# Patient Record
Sex: Male | Born: 1970 | Race: White | Hispanic: No | Marital: Married | Smoking: Never smoker
Health system: Southern US, Community
[De-identification: ages and names within clinical notes are randomized; demographics above are authoritative.]

## PROBLEM LIST (undated history)

## (undated) DIAGNOSIS — H409 Unspecified glaucoma: Secondary | ICD-10-CM

## (undated) DIAGNOSIS — Z9889 Other specified postprocedural states: Secondary | ICD-10-CM

## (undated) DIAGNOSIS — R112 Nausea with vomiting, unspecified: Secondary | ICD-10-CM

## (undated) DIAGNOSIS — K219 Gastro-esophageal reflux disease without esophagitis: Secondary | ICD-10-CM

---

## 2000-05-28 ENCOUNTER — Ambulatory Visit (HOSPITAL_COMMUNITY): Admission: RE | Admit: 2000-05-28 | Discharge: 2000-05-28 | Payer: Self-pay | Admitting: *Deleted

## 2000-05-28 ENCOUNTER — Encounter: Payer: Self-pay | Admitting: *Deleted

## 2000-06-01 ENCOUNTER — Ambulatory Visit (HOSPITAL_COMMUNITY): Admission: RE | Admit: 2000-06-01 | Discharge: 2000-06-01 | Payer: Self-pay | Admitting: *Deleted

## 2003-07-03 ENCOUNTER — Ambulatory Visit (HOSPITAL_COMMUNITY): Admission: RE | Admit: 2003-07-03 | Discharge: 2003-07-03 | Payer: Self-pay | Admitting: Internal Medicine

## 2004-04-15 ENCOUNTER — Ambulatory Visit: Payer: Self-pay | Admitting: Internal Medicine

## 2004-05-17 ENCOUNTER — Ambulatory Visit (HOSPITAL_COMMUNITY): Admission: RE | Admit: 2004-05-17 | Discharge: 2004-05-17 | Payer: Self-pay | Admitting: Internal Medicine

## 2004-05-17 ENCOUNTER — Ambulatory Visit: Payer: Self-pay | Admitting: Internal Medicine

## 2004-06-05 ENCOUNTER — Ambulatory Visit: Payer: Self-pay | Admitting: Internal Medicine

## 2004-07-08 ENCOUNTER — Ambulatory Visit (HOSPITAL_COMMUNITY): Admission: RE | Admit: 2004-07-08 | Discharge: 2004-07-08 | Payer: Self-pay | Admitting: Internal Medicine

## 2005-08-20 ENCOUNTER — Ambulatory Visit: Payer: Self-pay | Admitting: Internal Medicine

## 2009-08-30 ENCOUNTER — Ambulatory Visit (HOSPITAL_COMMUNITY): Admission: RE | Admit: 2009-08-30 | Discharge: 2009-08-30 | Payer: Self-pay | Admitting: Internal Medicine

## 2010-06-14 NOTE — Consult Note (Signed)
NAME:  Matthew Carr, Matthew Carr                ACCOUNT NO.:  [id number]   MEDICAL RECORD NO.:  [id number]          PATIENT TYPE:  AMB   LOCATION:                                 FACILITY:   PHYSICIAN:  Lionel December, M.D.    DATE OF BIRTH:  [birth year]   DATE OF CONSULTATION:  04/15/2004  DATE OF DISCHARGE:                                   CONSULTATION   REASON FOR CONSULTATION:  Dysphagia and abnormal esophagogram.   HISTORY OF PRESENT ILLNESS:  Matthew Carr is a 40 year old Caucasian male who has  history of dysphagia.  He was last seen by Dr. Jena Gauss in November 1996.  He  had EGD revealing antral gastritis but normal esophagus.  His esophagus was  dilated with a 54 [place] Maloney dilator.  Postop he did have some  discomfort, but his dysphagia resolved until about 18-24 months ago.  Since  then he has been having dysphagia primarily to solids, particularly bread  and meats.  He also has difficulty swallowing pills.  Lately he has been  having vague discomfort across the chest describing as throbbing.  He was  seen by Dr. Sherwood Gambler.  Barium study was obtained, which revealed noncritical  cervical esophageal web and a tubular narrowing in the thoracic esophagus at  the level of aortic arch.  Barium pill passed across the web without any  difficulty but did not pass the area of stricture.  He was therefore  scheduled for this visit.  The patient complains of heartburn at a frequency  of less than two times a week.  It is relieved with Tums.  He denies  hoarseness or chronic cough.  He does have intermittent need to clear his  throat, but he feels that this is precipitated by postnasal discharge.  He  has a good appetite.  He denies melena or rectal bleeding.  He has  occasional blood on the tissue, which he feels is secondary to hemorrhoids.  There is no prior history of lye ingestion.   He is on Tums p.r.n., NyQuil p.r.n., and Activate cream p.r.n.   PAST MEDICAL HISTORY:  Antral gastritis diagnosed,  and he was treated for H.  pylori gastritis back in November 1996.   ALLERGIES:  None known.   FAMILY HISTORY:  Both parents are in good health.  He does not have any  siblings.   SOCIAL HISTORY:  He is married.  He has two sons.  He works at [employer]  in [place].  Does not smoke cigarettes, and drinks alcohol very occasionally.   PHYSICAL EXAMINATION:  GENERAL:  A pleasant, well-developed, well-nourished  Caucasian male who is in no acute distress.  VITAL SIGNS:  He weighs 185-1/2 pounds.  He is 5 feet 10 inches tall.  Pulse  68 per minute, blood pressure 124/86, temperature is 97.8.  HEENT:  Conjunctivae are pink, sclerae are nonicteric.  Oropharyngeal mucosa  is normal.  NECK:  No neck masses noted.  CARDIAC:  Regular rhythm, normal S1 and S2.  No murmur or gallop noted.  CHEST:  Lungs are clear to auscultation.  ABDOMEN:  Symmetrical, soft, without tenderness, organomegaly or masses.  EXTREMITIES:  No peripheral edema or clubbing noted.   Barium study films are available for review.  These were done about six  months ago.  He does have an area of tubular narrowing site where a barium  pill got lodged.  There also appears to be narrowing at distal esophagus.  The pill never made it to this segment.   ASSESSMENT:  Matthew Carr is a 40 year old Caucasian male with history of vague  retrosternal discomfort and intermittent solid food dysphagia, who is noted  to have a noncritical esophageal web, tubular narrowing to thoracic or  midesophagus, and he may also have narrowing at gastroesophageal junction.  He gives history of infrequent heartburn.  He is not on any maintenance  therapy.  There is also no history of lye ingestion in the past.   It is possible that he has chronic gastroesophageal reflux disease, more or  less silent, leading to esophageal stricture.  I doubt that he has Barrett's  esophagus.   RECOMMENDATIONS:  Diagnostic esophagogastroduodenoscopy with esophageal   dilation to be performed within the next three weeks or so.  I would hold  off using PPI until the time of endoscopy.  If he has evidence of erosive  esophagitis, it needs to be documented rather than treating him, documented  prior to any chronic therapy.   I reviewed the procedure risks with the patient, and he is agreeable.   I would like to thank Dr. Sherwood Gambler for the opportunity to participate in the  care of this gentleman.      NR/MEDQ  D:  04/15/2004  T:  04/16/2004  Job:  [id number]   cc:   [facility]  Fax: [phone]   Madelin Rear. Sherwood Gambler, MD  [address]  [place]  [place] [address]  Fax: [phone]

## 2010-06-14 NOTE — Op Note (Signed)
Matthew Carr, Matthew Carr                ACCOUNT NO.:  [id number]   MEDICAL RECORD NO.:  [id number]          PATIENT TYPE:  AMB   LOCATION:  DAY                           FACILITY:  [facility]   PHYSICIAN:  Lionel December, M.D.    DATE OF BIRTH:  11/04/1970   DATE OF PROCEDURE:  05/17/2004  DATE OF DISCHARGE:                                 OPERATIVE REPORT   PROCEDURE:  Esophagogastroduodenoscopy with esophageal dilation.   INDICATIONS:  Martavious is a 40 year old Caucasian male who presents with solid  food dysphagia.  Barium study reveals a ring in the proximal esophagus and  narrowing at the body of the esophagus and possibly distally.  He did have  EGD/ED 10 years ago.  This was performed by Dr. Phylliss Bob.  His esophagus was  dilated by passing 54-French Maloney dilator.  He experienced chest pain for  few days with, but his dysphagia did improve until 18 or 24 months ago.  He  does not have chronic heartburn or regurgitation.  The procedure risks were  reviewed the patient, informed consent was obtained.   PREMEDICATION:  Cetacaine spray for pharyngeal topical anesthesia, Demerol  50 mg IV, Versed 14 mg IV.   FINDINGS:  Procedure performed in endoscopy suite.  The patient's vital  signs and O2 saturation were monitored during procedure and remained stable.  The patient was placed left lateral recumbent position and Olympus video  scope was passed via oropharynx without any difficulty into esophagus.   Esophagus:  There was web just below UES, which was well-visualized and a  picture taken for the record.  There was a stricture at 25-26 cm.  This was  initially dilated by passing the scope through it.  There was a noncritical narrowing at the body as well as distal esophagus,  but I was able pass the scope without any difficulty.  The GE junction was  at 40 cm.  No hernia was noted.  There was a circumferential fine wrinkling  to mucosa at body suggestive of eosinophilic esophagitis.   Stomach:   It was empty and distended very well with insufflation.  Folds of  the proximal stomach were normal.  Examination of the mucosa at body,  antrum, pyloric channel as well as angularis, fundus and cardia was normal.   Duodenum:  There was a single erosion at bulb.  The rest of the mucosa was  normal.  Mucosa and folds in postbulbar duodenum were also normal.  Endoscope was withdrawn.   Esophagus was dilated by passing 46-French Maloney dilator to full  insertion.  Some resistance noted early on.  As the dilator was withdrawn,  endoscope was passed again and there was a mucosal disruption at four  different levels.  The proximal one was at the level of stricture, then the  most distal was at and just above GE junction.  The tears were quite wide,  but they were all superficial.  Pictures taken for the record.  Endoscope  was withdrawn.  I did not attempt to pass the larger-size dilators.  Biopsy was also taken on the way  out from esophageal mucosa away from the  tears.   The patient tolerated the procedure well.   FINAL DIAGNOSES:  1.  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  2.  Single bulbar erosion.  3.  Esophagus:  Narrowing at the body and distally.  A fine mucosal      wrinkling suggestive of eosinophilic esophagitis.  4.  Esophagus dilated by passing 46-French Maloney dilator resulting in      mucosal disruption at four levels.  5.  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  6.  A single bulbar erosion.   RECOMMENDATIONS:  1.  Antireflux measures.  2.  Prilosec 20 milligrams p.o. q.a.m.  3.  Pulmicort inhaler two puffs b.i.d. for 1 month.  4.  I will be contacting the patient with biopsy results and plan to seen in      the office in six months from now.      NR/MEDQ  D:  05/17/2004  T:  05/17/2004  Job:  [id number]   cc:   Madelin Rear. Sherwood Gambler, MD  [address]  [place]  [place] [address]  Fax: [phone]

## 2015-01-17 ENCOUNTER — Encounter (INDEPENDENT_AMBULATORY_CARE_PROVIDER_SITE_OTHER): Payer: Self-pay | Admitting: *Deleted

## 2015-02-14 ENCOUNTER — Ambulatory Visit (INDEPENDENT_AMBULATORY_CARE_PROVIDER_SITE_OTHER): Payer: Self-pay | Admitting: Internal Medicine

## 2016-01-17 ENCOUNTER — Other Ambulatory Visit (HOSPITAL_COMMUNITY): Payer: Self-pay | Admitting: Internal Medicine

## 2016-01-17 DIAGNOSIS — R319 Hematuria, unspecified: Secondary | ICD-10-CM

## 2016-01-17 DIAGNOSIS — M546 Pain in thoracic spine: Secondary | ICD-10-CM

## 2016-01-22 ENCOUNTER — Encounter (HOSPITAL_COMMUNITY): Payer: Self-pay

## 2016-01-23 ENCOUNTER — Ambulatory Visit (HOSPITAL_COMMUNITY)
Admission: RE | Admit: 2016-01-23 | Discharge: 2016-01-23 | Disposition: A | Discharge status: Home / Self Care | Payer: 59 | Source: Ambulatory Visit | Attending: Internal Medicine | Admitting: Internal Medicine

## 2016-01-23 DIAGNOSIS — M546 Pain in thoracic spine: Secondary | ICD-10-CM | POA: Diagnosis not present

## 2016-01-23 DIAGNOSIS — R319 Hematuria, unspecified: Secondary | ICD-10-CM | POA: Diagnosis present

## 2016-01-23 MED ORDER — IOPAMIDOL (ISOVUE-300) INJECTION 61%
100.0000 mL | Freq: Once | INTRAVENOUS | Status: AC | PRN
  Administered 2016-01-23: 100 mL via INTRAVENOUS

## 2016-02-06 ENCOUNTER — Encounter: Payer: Self-pay | Admitting: Cardiovascular Disease

## 2016-02-21 ENCOUNTER — Encounter: Payer: Self-pay | Admitting: Cardiology

## 2016-03-28 ENCOUNTER — Ambulatory Visit (INDEPENDENT_AMBULATORY_CARE_PROVIDER_SITE_OTHER): Payer: 59 | Admitting: Cardiovascular Disease

## 2016-03-28 ENCOUNTER — Encounter: Payer: Self-pay | Admitting: Cardiovascular Disease

## 2016-03-28 VITALS — BP 130/80 | HR 75 | Ht 70.0 in | Wt 189.0 lb

## 2016-03-28 DIAGNOSIS — Z136 Encounter for screening for cardiovascular disorders: Secondary | ICD-10-CM

## 2016-03-28 DIAGNOSIS — R079 Chest pain, unspecified: Secondary | ICD-10-CM

## 2016-03-28 DIAGNOSIS — K219 Gastro-esophageal reflux disease without esophagitis: Secondary | ICD-10-CM | POA: Insufficient documentation

## 2016-03-28 NOTE — Progress Notes (Signed)
CARDIOLOGY CONSULT NOTE  Patient ID: Matthew Carr MRN: [id number] DOB/AGE: 27-Aug-1970 46 y.o.  Admit date: (Not on file) Primary Physician: Cassell SmilesFUSCO,LAWRENCE J., MD Referring Physician:   Reason for Consultation: chest pain  HPI: 46 yr old male with GERD who presents for the evaluation of chest pain. He has had intermittent retrosternal chest pains over the past 3 or 4 years. They usually occur at rest and last seconds. He runs up steep hills and works out on the elliptical without any exertional chest pain or dyspnea. He does not complain of a decline in energy levels over the past year. He has a history of GERD with at least 2 esophageal dilatations in the past.  CT of the abdomen and 01/24/16: Tiny subcentimeter left renal cortical cyst.  He does believe he pulled a chest wall muscle recently.  He denies a history of hypertension, hyperlipidemia, tobacco use, and diabetes.  ECG performed in the office today which I personally reviewed demonstrates normal sinus rhythm with no ischemic ST segment or T-wave abnormalities, nor any arrhythmias.    No Known Allergies  Current Outpatient Prescriptions  Medication Sig Dispense Refill  . omeprazole (PRILOSEC) 20 MG capsule Take 20 mg by mouth daily.     No current facility-administered medications for this visit.     History reviewed. No pertinent past medical history.  History reviewed. No pertinent surgical history.  Social History   Social History  . Marital status: Married    Spouse name: N/A  . Number of children: N/A  . Years of education: N/A   Occupational History  . Not on file.   Social History Main Topics  . Smoking status: Never Smoker  . Smokeless tobacco: Never Used  . Alcohol use No  . Drug use: No  . Sexual activity: Not on file   Other Topics Concern  . Not on file   Social History Narrative  . No narrative on file     No family history of premature CAD in 1st degree  relatives.  Prior to Admission medications   Medication Sig Start Date End Date Taking? Authorizing Provider  omeprazole (PRILOSEC) 20 MG capsule Take 20 mg by mouth daily.   Yes Historical Provider, MD     Review of systems complete and found to be negative unless listed above in HPI     Physical exam Blood pressure 130/80, pulse 75, height 5\' 10"  (1.778 m), weight 189 lb (85.7 kg), SpO2 95 %. General: NAD Neck: No JVD, no thyromegaly or thyroid nodule.  Lungs: Clear to auscultation bilaterally with normal respiratory effort. CV: Nondisplaced PMI. Regular rate and rhythm, normal S1/S2, no S3/S4, no murmur.  No peripheral edema.  No carotid bruit.  Abdomen: Soft, nontender, no hepatosplenomegaly, no distention.  Skin: Intact without lesions or rashes.  Neurologic: Alert and oriented x 3.  Psych: Normal affect. Extremities: No clubbing or cyanosis.  HEENT: Normal.   ECG: Most recent ECG reviewed.  Telemetry: Independently reviewed.  Labs:  No results found for: WBC, HGB, HCT, MCV, PLT No results for input(s): NA, K, CL, CO2, BUN, CREATININE, CALCIUM, PROT, BILITOT, ALKPHOS, ALT, AST, GLUCOSE in the last 168 hours.  Invalid input(s): LABALBU No results found for: CKTOTAL, CKMB, CKMBINDEX, TROPONINI No results found for: CHOL No results found for: HDL No results found for: LDLCALC No results found for: TRIG No results found for: CHOLHDL No results found for: LDLDIRECT       Studies: No  results found.  ASSESSMENT AND PLAN:  1. Chest pain: ECG is entirely normal. He lacks traditional risk factors. He has no exertional component to his symptoms. He does report pulling a chest wall muscle in the recent past. I believe his symptoms are musculoskeletal in etiology. Given the fact that he has had esophageal dilatations in the past, esophageal spasm should also be considered in the differential diagnosis. I do not feel stress testing is indicated at this time. Should he develop  exertional symptoms or a decline in energy levels, I would reconsider noninvasive cardiac testing.  Dispo: fu prn.   Signed: Prentice Docker, M.D., F.A.C.C.  03/28/2016, 2:39 PM

## 2016-03-28 NOTE — Patient Instructions (Signed)
Your physician recommends that you schedule a follow-up appointment in: as needed     Your physician recommends that you continue on your current medications as directed. Please refer to the Current Medication list given to you today.   Thank you for choosing [facility] !         

## 2017-04-02 ENCOUNTER — Encounter (INDEPENDENT_AMBULATORY_CARE_PROVIDER_SITE_OTHER): Payer: Self-pay | Admitting: Internal Medicine

## 2017-04-02 ENCOUNTER — Ambulatory Visit (INDEPENDENT_AMBULATORY_CARE_PROVIDER_SITE_OTHER): Payer: 59 | Admitting: Internal Medicine

## 2017-04-02 VITALS — BP 138/80 | HR 64 | Temp 98.4°F | Ht 70.0 in | Wt 194.1 lb

## 2017-04-02 DIAGNOSIS — R1319 Other dysphagia: Secondary | ICD-10-CM

## 2017-04-02 DIAGNOSIS — K222 Esophageal obstruction: Secondary | ICD-10-CM

## 2017-04-02 DIAGNOSIS — R1310 Dysphagia, unspecified: Secondary | ICD-10-CM | POA: Diagnosis not present

## 2017-04-02 NOTE — Patient Instructions (Signed)
The risks of bleeding, perforation and infection were reviewed with patient.  

## 2017-04-02 NOTE — Progress Notes (Signed)
   Subjective:    Patient ID: Matthew MeuseJason N Leggio, male    DOB: November 22, 1970, 47 y.o.   MRN: [id number] He states he becomes nauseated with the anesthesia they give him.  HPI Referred by Dr. Sherwood GamblerFusco for esophageal stricture. He states is time to have his esophagus stretched again. Last EGD/ED was about 12 yrs ago. His last EGD/ED was 05/17/2004.  FINAL DIAGNOSES:  1.  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  2.  Single bulbar erosion.  3.  Esophagus:  Narrowing at the body and distally.  A fine mucosal      wrinkling suggestive of eosinophilic esophagitis.  4.  Esophagus dilated by passing 46-French Maloney dilator resulting in      mucosal disruption at four levels.  5.  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  6.  A single bulbar erosion. He is having trouble swallowing. Foods are slow to go down. Hx of esophageal stricture. He has had some foods to lodge. Mega Red pills give him trouble. His appetite is good. No weight loss.  BMs are normal.  He chews his food well and slow. He does not eat steak because of the stricture.  No Nsaids.  He has been taking the Prilosec as needed. He opens the Prilosec up to swallow it.  Review of Systems History reviewed. No pertinent past medical history.  History reviewed. No pertinent surgical history.  No Known Allergies  Current Outpatient Medications on File Prior to Visit  Medication Sig Dispense Refill  . omeprazole (PRILOSEC) 20 MG capsule Take 20 mg by mouth daily.     No current facility-administered medications on file prior to visit.         Objective:   Physical Exam Blood pressure 138/80, pulse 64, temperature 98.4 F (36.9 C), height 5\' 10"  (1.778 m), weight 194 lb 1.6 oz (88 kg). Alert and oriented. Skin warm and dry. Oral mucosa is moist.   . Sclera anicteric, conjunctivae is pink. Thyroid not enlarged. No cervical lymphadenopathy. Lungs clear. Heart regular rate  and rhythm.  Abdomen is soft. Bowel sounds are positive. No hepatomegaly. No abdominal masses felt. No tenderness.  No edema to lower extremities.        Assessment & Plan:  Dysphagia. Prior hx of esophageal stricture. Needs EGD/ED. Take the Prilosec daily.

## 2017-04-03 DIAGNOSIS — R1319 Other dysphagia: Secondary | ICD-10-CM | POA: Insufficient documentation

## 2017-04-03 DIAGNOSIS — K222 Esophageal obstruction: Secondary | ICD-10-CM | POA: Insufficient documentation

## 2017-04-03 DIAGNOSIS — R1310 Dysphagia, unspecified: Secondary | ICD-10-CM | POA: Insufficient documentation

## 2017-04-06 ENCOUNTER — Encounter (INDEPENDENT_AMBULATORY_CARE_PROVIDER_SITE_OTHER): Payer: Self-pay | Admitting: *Deleted

## 2017-04-10 ENCOUNTER — Other Ambulatory Visit: Payer: Self-pay

## 2017-04-10 ENCOUNTER — Encounter (HOSPITAL_COMMUNITY): Payer: Self-pay | Admitting: *Deleted

## 2017-04-10 ENCOUNTER — Encounter (HOSPITAL_COMMUNITY)
Admission: RE | Disposition: A | Discharge status: Home / Self Care | Payer: Self-pay | Source: Ambulatory Visit | Attending: Internal Medicine

## 2017-04-10 ENCOUNTER — Ambulatory Visit (HOSPITAL_COMMUNITY)
Admission: RE | Admit: 2017-04-10 | Discharge: 2017-04-10 | Disposition: A | Discharge status: Home / Self Care | Payer: 59 | Source: Ambulatory Visit | Attending: Internal Medicine | Admitting: Internal Medicine

## 2017-04-10 DIAGNOSIS — K219 Gastro-esophageal reflux disease without esophagitis: Secondary | ICD-10-CM | POA: Diagnosis not present

## 2017-04-10 DIAGNOSIS — R1319 Other dysphagia: Secondary | ICD-10-CM | POA: Insufficient documentation

## 2017-04-10 DIAGNOSIS — K222 Esophageal obstruction: Secondary | ICD-10-CM | POA: Diagnosis not present

## 2017-04-10 DIAGNOSIS — R1310 Dysphagia, unspecified: Secondary | ICD-10-CM | POA: Insufficient documentation

## 2017-04-10 DIAGNOSIS — R1314 Dysphagia, pharyngoesophageal phase: Secondary | ICD-10-CM | POA: Diagnosis not present

## 2017-04-10 DIAGNOSIS — K209 Esophagitis, unspecified: Secondary | ICD-10-CM | POA: Diagnosis not present

## 2017-04-10 HISTORY — PX: ESOPHAGEAL DILATION: SHX303

## 2017-04-10 HISTORY — DX: Other specified postprocedural states: R11.2

## 2017-04-10 HISTORY — DX: Other specified postprocedural states: Z98.890

## 2017-04-10 HISTORY — PX: ESOPHAGOGASTRODUODENOSCOPY: SHX5428

## 2017-04-10 SURGERY — EGD (ESOPHAGOGASTRODUODENOSCOPY)
Anesthesia: Moderate Sedation

## 2017-04-10 MED ORDER — MEPERIDINE HCL 50 MG/ML IJ SOLN
INTRAMUSCULAR | Status: AC
  Filled 2017-04-10: qty 1

## 2017-04-10 MED ORDER — MEPERIDINE HCL 50 MG/ML IJ SOLN
INTRAMUSCULAR | Status: DC | PRN
  Administered 2017-04-10 (×4): 25 mg via INTRAVENOUS

## 2017-04-10 MED ORDER — MIDAZOLAM HCL 5 MG/5ML IJ SOLN
INTRAMUSCULAR | Status: AC
  Filled 2017-04-10: qty 10

## 2017-04-10 MED ORDER — STERILE WATER FOR IRRIGATION IR SOLN
Status: DC | PRN
  Administered 2017-04-10: 2.5 mL

## 2017-04-10 MED ORDER — SODIUM CHLORIDE 0.9 % IV SOLN
INTRAVENOUS | Status: DC
  Administered 2017-04-10: 14:00:00 via INTRAVENOUS

## 2017-04-10 MED ORDER — LIDOCAINE VISCOUS 2 % MT SOLN
OROMUCOSAL | Status: AC
  Filled 2017-04-10: qty 15

## 2017-04-10 MED ORDER — MONTELUKAST SODIUM 10 MG PO TABS: 10.0000 mg | ORAL_TABLET | Freq: Every day | ORAL | 3 refills | Status: DC

## 2017-04-10 MED ORDER — SCOPOLAMINE 1 MG/3DAYS TD PT72
MEDICATED_PATCH | TRANSDERMAL | Status: AC
  Filled 2017-04-10: qty 1

## 2017-04-10 MED ORDER — MIDAZOLAM HCL 5 MG/5ML IJ SOLN
INTRAMUSCULAR | Status: DC | PRN
  Administered 2017-04-10: 1 mg via INTRAVENOUS
  Administered 2017-04-10: 3 mg via INTRAVENOUS
  Administered 2017-04-10 (×3): 2 mg via INTRAVENOUS

## 2017-04-10 MED ORDER — SCOPOLAMINE 1 MG/3DAYS TD PT72
1.0000 | MEDICATED_PATCH | TRANSDERMAL | Status: DC
  Administered 2017-04-10: 1.5 mg via TRANSDERMAL

## 2017-04-10 MED ORDER — LIDOCAINE VISCOUS 2 % MT SOLN
OROMUCOSAL | Status: DC | PRN
  Administered 2017-04-10: 1 via OROMUCOSAL

## 2017-04-10 MED ORDER — FLUTICASONE PROPIONATE HFA 220 MCG/ACT IN AERO: INHALATION_SPRAY | RESPIRATORY_TRACT | 0 refills | Status: DC

## 2017-04-10 NOTE — H&P (Signed)
Matthew MeuseJason N Carr is an [age] y.o. male.   Chief Complaint: Patient is here for EGD and ED. HPI: Patient is a 47 year old Caucasian male who presents with a year history of intermittent solid food dysphagia.  She has a history of esophageal stricture felt to be secondary to eosinophilic esophagitis and last dilation was 12 years ago.  He denies nausea vomiting abdominal pain or melena.  He was using omeprazole on as-needed basis.  He is not taking daily with satisfactory control of GERD.  Past Medical History:  Diagnosis Date  . 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      Dilated twice in the past most recently 12 years ago.      GERD       History reviewed. No pertinent surgical history.  Family History  Problem Relation Age of Onset  . Hypertension Mother   . CAD Paternal Grandfather    Social History:  reports that  has never smoked. he has never used smokeless tobacco. He reports that he does not drink alcohol or use drugs.  Allergies: No Known Allergies  Medications Prior to Admission  Medication Sig Dispense Refill  . Cyanocobalamin 2500 MCG CHEW Chew 2,500 mcg by mouth daily.    Marland Kitchen. erythromycin ophthalmic ointment Place 1 application into both eyes daily as needed (for skin cracking around eye).     . fluticasone (CUTIVATE) 0.05 % cream Apply 1 application topically daily as needed (for itch/red face. Mixed with Metronidazole.).    Marland Kitchen. metroNIDAZOLE (METROCREAM) 0.75 % cream Apply 1 application topically daily as needed (for itch/red face. Mixed with Fluticasone.).    Marland Kitchen. Omega Fatty Acids-Vitamins (OMEGA-3 GUMMIES) CHEW Chew 1 each by mouth daily.    Marland Kitchen. omeprazole (PRILOSEC) 20 MG capsule Take 20 mg by mouth daily.      No results found for this or any previous visit (from the past 48 hour(s)). No results found.  ROS  Blood pressure 128/86, pulse 72, temperature 98.6 F (37 C), temperature source Oral, resp. rate 11, SpO2 99  %. Physical Exam  Constitutional: He appears well-developed and well-nourished.  HENT:  Mouth/Throat: Oropharynx is clear and moist.  Eyes: Conjunctivae are normal. No scleral icterus.  Neck: No thyromegaly present.  Cardiovascular: Normal rate, regular rhythm and normal heart sounds.  No murmur heard. Respiratory: Effort normal and breath sounds normal.  GI: Soft. He exhibits no distension and no mass. There is no tenderness.  Musculoskeletal: He exhibits no edema.  Lymphadenopathy:    He has no cervical adenopathy.  Neurological: He is alert.  Skin: Skin is warm and dry.     Assessment/Plan Esophageal dysphagia. 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 EGD with ED.  Lionel DecemberNajeeb Emmagrace Runkel, MD 04/10/2017, 3:23 PM

## 2017-04-10 NOTE — Discharge Instructions (Signed)
No aspirin or NSAIDs for 3 days. Resume usual medications as before. Montelukast 10 mg p.o. Daily. Fluticasone 4 puffs twice daily as directed(each dose is 880 mcg)) Resume usual diet. No driving for 24 hours. Physician will call with biopsy results.   Scopolamine patch behind right ear can be removed by Sunday or earlier. Wash hands after removal   Gastrointestinal Endoscopy, Care After Refer to this sheet in the next few weeks. These instructions provide you with information about caring for yourself after your procedure. Your health care provider may also give you more specific instructions. Your treatment has been planned according to current medical practices, but problems sometimes occur. Call your health care provider if you have any problems or questions after your procedure. What can I expect after the procedure? After your procedure, it is common to feel:  Bloated.  Soreness in your throat.  Sleepy.  Follow these instructions at home:  Do not drive for 24 hours if you received a if you received a medicine to help you relax (sedative).  Avoid drinking warm beverages and alcohol for the first 24 hours after the procedure.  Take over-the-counter and prescription medicines only as told by your health care provider.  Drink enough fluids to keep your urine clear or pale yellow.  If you feel bloated, try going for a walk. Walking may help the feeling go away.  If your throat is sore, try gargling with salt water. Get help right away if:  You have severe nausea or vomiting.  You have severe abdominal pain, abdominal cramps that last longer than 6 hours, or abdominal swelling.  You have severe shoulder or back pain.  You have trouble swallowing.  You have shortness of breath, your breathing is shallow, or you breathing is faster than normal.  You have a fever.  Your heart is beating very fast.  You vomit blood or material that looks like coffee grounds.  You have  bloody, black, or tarry stools. This information is not intended to replace advice given to you by your health care provider. Make sure you discuss any questions you have with your health care provider.

## 2017-04-10 NOTE — Op Note (Signed)
[facility] Patient Name: Matthew Carr Procedure Date: 04/10/2017 3:06 PM MRN: [id number] Date of Birth: [birth year] Attending MD: Lionel December , MD CSN: [id number] Age: 47 Admit Type: Outpatient Procedure:                Upper GI endoscopy Indications:              Esophageal dysphagia, For therapy of esophageal                            stricture Providers:                Lionel December, MD, Loma Messing B. Patsy Lager, RN, Edythe Clarity, Technician Referring MD:             Elfredia Nevins, MD Medicines:                Lidocaine spray, Meperidine 100 mg IV, Midazolam 10                            mg IV Complications:            No immediate complications. Estimated Blood Loss:     Estimated blood loss was minimal. Procedure:                Pre-Anesthesia Assessment:                           - Prior to the procedure, a History and Physical                            was performed, and patient medications and                            allergies were reviewed. The patient's tolerance of                            previous anesthesia was also reviewed. The risks                            and benefits of the procedure and the sedation                            options and risks were discussed with the patient.                            All questions were answered, and informed consent                            was obtained. Prior Anticoagulants: The patient has                            taken no previous anticoagulant or antiplatelet  agents. ASA Grade Assessment: I - A normal, healthy                            patient. After reviewing the risks and benefits,                            the patient was deemed in satisfactory condition to                            undergo the procedure.                           After obtaining informed consent, the endoscope was                            passed under direct vision. Throughout the                            procedure, the patient's blood pressure, pulse, and                            oxygen saturations were monitored continuously. The                            EG-[id number] ([id number]) scope was introduced through the                            mouth, and advanced to the second part of duodenum.                            The upper GI endoscopy was accomplished without                            difficulty. The patient tolerated the procedure                            well. Scope In: 3:39:39 PM Scope Out: 3:55:28 PM Total Procedure Duration: 0 hours 15 minutes 49 seconds  Findings:      One severe benign-appearing, intrinsic stenosis was found 23 cm from the       incisors. This measured 8 mm (inner diameter) x 2 cm (in length) and was       traversed after downsizing scope. A TTS dilator was passed through the       scope. Dilation with a 11-07-10 mm balloon dilator was performed to 10       mm, 11 mm and 12 mm. The dilation site was examined and showed mild       improvement in luminal narrowing and no perforation.      Mucosal changes including ringed esophagus, longitudinal furrows and       small-caliber esophagus were found in the entire esophagus. Biopsies       were taken with a cold forceps for histology.      The Z-line was regular and was found 40 cm from the incisors.      The entire examined stomach was normal.  The duodenal bulb and second portion of the duodenum were normal. Impression:               - Benign-appearing esophageal stenosis. Dilated.                           - Esophageal mucosal changes consistent with                            eosinophilic esophagitis. Biopsied.                           - Z-line regular, 40 cm from the incisors.                           - Normal stomach.                           - Normal duodenal bulb and second portion of the                            duodenum. Moderate Sedation:      Moderate (conscious)  sedation was administered by the endoscopy nurse       and supervised by the endoscopist. The following parameters were       monitored: oxygen saturation, heart rate, blood pressure, CO2       capnography and response to care. Total physician intraservice time was       26 minutes. Recommendation:           - Patient has a contact number available for                            emergencies. The signs and symptoms of potential                            delayed complications were discussed with the                            patient. Return to normal activities tomorrow.                            Written discharge instructions were provided to the                            patient.                           - Resume previous diet today.                           - Continue present medications.                           - Await pathology results.                           - Fluticasone 880 mcg bid as directed.                           -  Montelukast 10 mg po qd. Procedure Code(s):        --- Professional ---                           [phone], Esophagogastroduodenoscopy, flexible,                            transoral; with transendoscopic balloon dilation of                            esophagus (less than 30 mm diameter)                           43239, Esophagogastroduodenoscopy, flexible,                            transoral; with biopsy, single or multiple                           99152, Moderate sedation services provided by the                            same physician or other qualified health care                            professional performing the diagnostic or                            therapeutic service that the sedation supports,                            requiring the presence of an independent trained                            observer to assist in the monitoring of the                            patient's level of consciousness and physiological                             status; initial 15 minutes of intraservice time,                            patient age 12 years or older                           [phone] [id number], Moderate sedation services; each additional                            15 minutes intraservice time Diagnosis Code(s):        --- Professional ---                           K22.2, Esophageal obstruction  K20.9, Esophagitis, unspecified                           R13.14, Dysphagia, pharyngoesophageal phase CPT copyright 2016 [employer]. All rights reserved. The codes documented in this report are preliminary and upon coder review may  be revised to meet current compliance requirements. Lionel December, MD Lionel December, MD 04/10/2017 4:08:20 PM This report has been signed electronically. Number of Addenda: 0

## 2017-04-16 ENCOUNTER — Encounter (INDEPENDENT_AMBULATORY_CARE_PROVIDER_SITE_OTHER): Payer: Self-pay | Admitting: *Deleted

## 2017-04-16 ENCOUNTER — Other Ambulatory Visit (INDEPENDENT_AMBULATORY_CARE_PROVIDER_SITE_OTHER): Payer: Self-pay | Admitting: *Deleted

## 2017-04-16 DIAGNOSIS — K222 Esophageal obstruction: Secondary | ICD-10-CM

## 2017-04-16 DIAGNOSIS — R1310 Dysphagia, unspecified: Secondary | ICD-10-CM

## 2017-04-29 ENCOUNTER — Encounter (HOSPITAL_COMMUNITY): Payer: Self-pay | Admitting: Internal Medicine

## 2017-05-28 ENCOUNTER — Ambulatory Visit (HOSPITAL_COMMUNITY)
Admission: RE | Admit: 2017-05-28 | Discharge: 2017-05-28 | Disposition: A | Discharge status: Home / Self Care | Payer: 59 | Source: Ambulatory Visit | Attending: Internal Medicine | Admitting: Internal Medicine

## 2017-05-28 DIAGNOSIS — R1310 Dysphagia, unspecified: Secondary | ICD-10-CM | POA: Diagnosis not present

## 2017-05-28 DIAGNOSIS — K219 Gastro-esophageal reflux disease without esophagitis: Secondary | ICD-10-CM | POA: Diagnosis not present

## 2017-05-28 DIAGNOSIS — K222 Esophageal obstruction: Secondary | ICD-10-CM | POA: Diagnosis present

## 2017-05-28 DIAGNOSIS — Q394 Esophageal web: Secondary | ICD-10-CM | POA: Diagnosis not present

## 2017-06-02 ENCOUNTER — Ambulatory Visit (INDEPENDENT_AMBULATORY_CARE_PROVIDER_SITE_OTHER): Payer: 59 | Admitting: Internal Medicine

## 2017-06-02 ENCOUNTER — Encounter (INDEPENDENT_AMBULATORY_CARE_PROVIDER_SITE_OTHER): Payer: Self-pay | Admitting: *Deleted

## 2017-06-02 ENCOUNTER — Encounter (INDEPENDENT_AMBULATORY_CARE_PROVIDER_SITE_OTHER): Payer: Self-pay | Admitting: Internal Medicine

## 2017-06-02 ENCOUNTER — Other Ambulatory Visit (INDEPENDENT_AMBULATORY_CARE_PROVIDER_SITE_OTHER): Payer: Self-pay | Admitting: *Deleted

## 2017-06-02 VITALS — BP 120/90 | HR 66 | Temp 98.3°F | Resp 18 | Ht 70.0 in | Wt 189.7 lb

## 2017-06-02 DIAGNOSIS — K222 Esophageal obstruction: Secondary | ICD-10-CM

## 2017-06-02 DIAGNOSIS — R1310 Dysphagia, unspecified: Secondary | ICD-10-CM

## 2017-06-02 DIAGNOSIS — K200 Eosinophilic esophagitis: Secondary | ICD-10-CM

## 2017-06-02 DIAGNOSIS — B370 Candidal stomatitis: Secondary | ICD-10-CM | POA: Diagnosis not present

## 2017-06-02 MED ORDER — NYSTATIN 100000 UNIT/ML MT SUSP: 5.0000 mL | Freq: Four times a day (QID) | OROMUCOSAL | 1 refills | Status: DC

## 2017-06-02 NOTE — Progress Notes (Signed)
Presenting complaint;  Follow-up for dysphagia/eosinophilic esophagitis.  Database and subjective:  Matthew Carr is a 47 year old Caucasian male who has a history of eosinophilic esophagitis.  His first EGD was possibly in 1996 when his esophagus was dilated with 54 [place] Maloney dilator by Dr.Rourk.  He noted improvement in his dysphagia which lasted for at least 2 years.  He underwent EGD with dilation and April 2006.  His esophagus was only dilated with 46 [place] Maloney dilator.  He had mucosal disruption at 4 different levels including the proximal segment as well as distal.  He was treated with topical steroid. He presented in March 2019 with intermittent solid food dysphagia. He had somewhat small caliber esophagus with typical changes of eosinophilic esophagitis and high-grade stricture in the proximal esophagus at 23 cm from the incisors.  The stricture was dilated from 10 to 12 mm.  He was begun on fluticasone.  But he has not yet finished therapy.  He feels better but he is still having difficulty swallowing solids.  He points to upper sternal area sort of bolus obstruction.  He has not had an episode of regurgitation.  He denies heartburn.  He had a barium study last week as planned.  His appetite is good.  He has lost 5 pounds in the last 2 months. He also has noted soreness in his mouth and throat over the last few days.   Current Medications: Outpatient Encounter Medications as of 06/02/2017  Medication Sig  . Cyanocobalamin 2500 MCG CHEW Chew 2,500 mcg by mouth daily.  Marland Kitchen erythromycin ophthalmic ointment Place 1 application into both eyes daily as needed (for skin cracking around eye).   . fluticasone (CUTIVATE) 0.05 % cream Apply 1 application topically daily as needed (for itch/red face. Mixed with Metronidazole.).  Marland Kitchen fluticasone (FLOVENT HFA) 220 MCG/ACT inhaler Use as directed.  . metroNIDAZOLE (METROCREAM) 0.75 % cream Apply 1 application topically daily as needed (for itch/red  face. Mixed with Fluticasone.).  Marland Kitchen montelukast (SINGULAIR) 10 MG tablet Take 1 tablet (10 mg total) by mouth at bedtime.  Ailene Ards Fatty Acids-Vitamins (OMEGA-3 GUMMIES) CHEW Chew 1 each by mouth daily.  Marland Kitchen omeprazole (PRILOSEC) 20 MG capsule Take 20 mg by mouth daily.   No facility-administered encounter medications on file as of 06/02/2017.      Objective: Blood pressure 120/90, pulse 66, temperature 98.3 F (36.8 C), temperature source Oral, resp. rate 18, height  (1.778 m), weight 189 lb 11.2 oz (86 kg). Patient is alert and in no acute distress. Conjunctiva is pink. Sclera is nonicteric He has patchy whitish exudate over soft and hard palate. No neck masses or thyromegaly noted. Cardiac exam with regular rhythm normal S1 and S2. No murmur or gallop noted. Lungs are clear to auscultation. Abdomen is symmetrical soft and nontender without organomegaly or masses. No LE edema or clubbing noted.  Labs/studies Results:  Barium study from 05/28/2017 reviewed. There is a valve/narrowing in proximal esophagus and distal esophageal stricture.  Barium pill passed across proximal stricture without any difficulty but there was delay in passage of pill across distal esophageal stricture.  He was also noted to have reflux.   Assessment:  #1.  Eosinophilic esophagitis.  He has proximal as well as distal esophageal stricture.  On his last EGD about 7 weeks ago proximal esophageal stricture was high-grade and dilated to 12 mm.  Barium pill passed across this area without delay.  Therefore he must be getting some benefit from fluticasone.  Since he is  still having dysphagia he will undergo repeat EGD with dilation when he finishes fluticasone.  Hopefully can document resolution of eosinophilic esophagitis on this exam.  #2.  Oral candidiasis.  Oral candidiasis secondary to topical steroid therapy.   Plan:  Patient will continue fluticasone topical until he finishes prescription. Mycostatin  suspension 500,000 units swish and swallow 4 times daily until prescription finished. Will schedule patient for EGD with dilation and few weeks. Office visit in 6 months.

## 2017-06-02 NOTE — Patient Instructions (Signed)
Esophagogastroduodenoscopy with esophageal dilation to be scheduled in 3 weeks.

## 2017-07-02 ENCOUNTER — Encounter (HOSPITAL_COMMUNITY): Payer: Self-pay | Admitting: *Deleted

## 2017-07-02 ENCOUNTER — Other Ambulatory Visit: Payer: Self-pay

## 2017-07-02 ENCOUNTER — Encounter (HOSPITAL_COMMUNITY)
Admission: RE | Disposition: A | Discharge status: Home / Self Care | Payer: Self-pay | Source: Ambulatory Visit | Attending: Internal Medicine

## 2017-07-02 ENCOUNTER — Ambulatory Visit (HOSPITAL_COMMUNITY)
Admission: RE | Admit: 2017-07-02 | Discharge: 2017-07-02 | Disposition: A | Discharge status: Home / Self Care | Payer: 59 | Source: Ambulatory Visit | Attending: Internal Medicine | Admitting: Internal Medicine

## 2017-07-02 DIAGNOSIS — K219 Gastro-esophageal reflux disease without esophagitis: Secondary | ICD-10-CM | POA: Diagnosis not present

## 2017-07-02 DIAGNOSIS — K222 Esophageal obstruction: Secondary | ICD-10-CM

## 2017-07-02 DIAGNOSIS — K3189 Other diseases of stomach and duodenum: Secondary | ICD-10-CM | POA: Diagnosis not present

## 2017-07-02 DIAGNOSIS — R1314 Dysphagia, pharyngoesophageal phase: Secondary | ICD-10-CM | POA: Diagnosis not present

## 2017-07-02 DIAGNOSIS — K200 Eosinophilic esophagitis: Secondary | ICD-10-CM | POA: Insufficient documentation

## 2017-07-02 DIAGNOSIS — Z79899 Other long term (current) drug therapy: Secondary | ICD-10-CM | POA: Insufficient documentation

## 2017-07-02 DIAGNOSIS — R1310 Dysphagia, unspecified: Secondary | ICD-10-CM | POA: Insufficient documentation

## 2017-07-02 HISTORY — PX: ESOPHAGEAL DILATION: SHX303

## 2017-07-02 HISTORY — PX: ESOPHAGOGASTRODUODENOSCOPY: SHX5428

## 2017-07-02 HISTORY — DX: Gastro-esophageal reflux disease without esophagitis: K21.9

## 2017-07-02 SURGERY — EGD (ESOPHAGOGASTRODUODENOSCOPY)
Anesthesia: Moderate Sedation

## 2017-07-02 MED ORDER — LIDOCAINE VISCOUS HCL 2 % MT SOLN
OROMUCOSAL | Status: DC | PRN
  Administered 2017-07-02: 1 via OROMUCOSAL

## 2017-07-02 MED ORDER — SODIUM CHLORIDE 0.9 % IV SOLN
INTRAVENOUS | Status: DC
  Administered 2017-07-02: 07:00:00 via INTRAVENOUS

## 2017-07-02 MED ORDER — MEPERIDINE HCL 50 MG/ML IJ SOLN
INTRAMUSCULAR | Status: AC
  Filled 2017-07-02: qty 1

## 2017-07-02 MED ORDER — SCOPOLAMINE 1 MG/3DAYS TD PT72
MEDICATED_PATCH | TRANSDERMAL | Status: AC
  Filled 2017-07-02: qty 1

## 2017-07-02 MED ORDER — SCOPOLAMINE 1 MG/3DAYS TD PT72
1.0000 | MEDICATED_PATCH | TRANSDERMAL | Status: DC
  Administered 2017-07-02: 1.5 mg via TRANSDERMAL

## 2017-07-02 MED ORDER — LIDOCAINE VISCOUS HCL 2 % MT SOLN
OROMUCOSAL | Status: AC
  Filled 2017-07-02: qty 15

## 2017-07-02 MED ORDER — MIDAZOLAM HCL 5 MG/5ML IJ SOLN
INTRAMUSCULAR | Status: AC
  Filled 2017-07-02: qty 10

## 2017-07-02 MED ORDER — MIDAZOLAM HCL 5 MG/5ML IJ SOLN
INTRAMUSCULAR | Status: DC | PRN
  Administered 2017-07-02: 1 mg via INTRAVENOUS
  Administered 2017-07-02 (×2): 2 mg via INTRAVENOUS
  Administered 2017-07-02: 3 mg via INTRAVENOUS
  Administered 2017-07-02: 2 mg via INTRAVENOUS

## 2017-07-02 MED ORDER — SIMETHICONE 40 MG/0.6ML PO SUSP
ORAL | Status: DC | PRN
  Administered 2017-07-02: 15 mL

## 2017-07-02 MED ORDER — MEPERIDINE HCL 50 MG/ML IJ SOLN
INTRAMUSCULAR | Status: DC | PRN
  Administered 2017-07-02 (×4): 25 mg via INTRAVENOUS

## 2017-07-02 NOTE — H&P (Signed)
Matthew Carr is an 47 y.o. male.   Chief Complaint: Patient is here for EGD and ED. HPI: Patient is a 47 year old Caucasian male with known history of eosinophilic esophagitis and esophageal stricture who underwent EGD with dilation on 04/10/2017 for high-grade proximal distal esophageal stricture.  The stricture was dilated to 12 mm with the balloon.  He was treated with fluticasone.  He is also on montelukast.  He had a barium study few weeks ago reveals proximal as well as distal esophageal narrowing.  Barium pill got held at distal esophagus.  He is still having solid food dysphagia therefore returning for repeat therapeutic EGD.  He states heartburn is well controlled with therapy.  He denies weight loss.  Past Medical History:  Diagnosis Date  . GERD (gastroesophageal reflux disease)   . PONV (postoperative nausea and vomiting)     Past Surgical History:  Procedure Laterality Date  . ESOPHAGEAL DILATION N/A 04/10/2017   Procedure: ESOPHAGEAL DILATION;  Surgeon: Malissa Hippoehman, Jaycee Mckellips U, MD;  Location: [facility];  Service: Endoscopy;  Laterality: N/A;  . ESOPHAGOGASTRODUODENOSCOPY N/A 04/10/2017   Procedure: ESOPHAGOGASTRODUODENOSCOPY (EGD);  Surgeon: Malissa Hippoehman, Lupie Sawa U, MD;  Location: [facility];  Service: Endoscopy;  Laterality: N/A;    Family History  Problem Relation Age of Onset  . Hypertension Mother   . CAD Paternal Grandfather    Social History:  reports that he has never smoked. He has never used smokeless tobacco. He reports that he does not drink alcohol or use drugs.  Allergies: No Known Allergies  Medications Prior to Admission  Medication Sig Dispense Refill  . Cyanocobalamin 2500 MCG CHEW Chew 2,500 mcg by mouth daily.    . fluticasone (CUTIVATE) 0.05 % cream Apply 1 application topically daily as needed (for itch/red face. Mixed with Metronidazole.).    Marland Kitchen. metroNIDAZOLE (METROCREAM) 0.75 % cream Apply 1 application topically daily as needed (for itch/red face. Mixed  with Fluticasone.).    Marland Kitchen. montelukast (SINGULAIR) 10 MG tablet Take 1 tablet (10 mg total) by mouth at bedtime. 90 tablet 3  . Omega Fatty Acids-Vitamins (OMEGA-3 GUMMIES) CHEW Chew 1 each by mouth daily.    Marland Kitchen. omeprazole (PRILOSEC) 20 MG capsule Take 20 mg by mouth daily.      No results found for this or any previous visit (from the past 48 hour(s)). No results found.  ROS  Blood pressure (!) 152/90, pulse 72, temperature 97.7 F (36.5 C), temperature source Oral, resp. rate 14, height 5\' 10"  (1.778 m), weight 190 lb (86.2 kg), SpO2 100 %. Physical Exam  Constitutional: He appears well-developed and well-nourished.  HENT:  Mouth/Throat: Oropharynx is clear and moist.  Eyes: Conjunctivae are normal. No scleral icterus.  Neck: No thyromegaly present.  Cardiovascular: Normal rate, regular rhythm and normal heart sounds.  No murmur heard. Respiratory: Effort normal and breath sounds normal.  GI: Soft. He exhibits no distension and no mass.  Musculoskeletal: He exhibits no edema.  Lymphadenopathy:    He has no cervical adenopathy.  Neurological: He is alert.  Skin: Skin is warm and dry.     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 EGD with ED.  Matthew DecemberNajeeb Lucion Dilger, MD 07/02/2017, 7:32 AM

## 2017-07-02 NOTE — Op Note (Signed)
[facility] Patient Name: Matthew Carr Procedure Date: 07/02/2017 7:15 AM MRN: [id number] Date of Birth: 25-Sep-1970 Attending MD: Lionel December , MD CSN: [id number] Age: 47 Admit Type: Outpatient Procedure:                Upper GI endoscopy Indications:              Esophageal dysphagia, Stricture of the esophagus,                            For therapy of esophageal stricture, For therapy of                            eosinophilic esophagitis Providers:                Lionel December, MD, Buel Ream. Thomasena Edis RN, RN, Edythe Clarity, Technician Referring MD:             Elfredia Nevins, MD Medicines:                Lidocaine spray, Meperidine 100 mg IV, Midazolam 10                            mg IV Complications:            No immediate complications. Estimated Blood Loss:     Estimated blood loss was minimal. Procedure:                Pre-Anesthesia Assessment:                           - Prior to the procedure, a History and Physical                            was performed, and patient medications and                            allergies were reviewed. The patient's tolerance of                            previous anesthesia was also reviewed. The risks                            and benefits of the procedure and the sedation                            options and risks were discussed with the patient.                            All questions were answered, and informed consent                            was obtained. Prior Anticoagulants: The patient has  taken no previous anticoagulant or antiplatelet                            agents. ASA Grade Assessment: I - A normal, healthy                            patient. After reviewing the risks and benefits,                            the patient was deemed in satisfactory condition to                            undergo the procedure.                           After obtaining informed  consent, the endoscope was                            passed under direct vision. Throughout the                            procedure, the patient's blood pressure, pulse, and                            oxygen saturations were monitored continuously. The                            EG-[id number] ([id number]) scope was introduced through the                            mouth, and advanced to the second part of duodenum.                            The upper GI endoscopy was accomplished without                            difficulty. The patient tolerated the procedure                            well. Scope In: 7:43:44 AM Scope Out: 7:58:23 AM Total Procedure Duration: 0 hours 14 minutes 39 seconds  Findings:      One benign-appearing, intrinsic mild stenosis was found 22 to 23 cm from       the incisors. This stenosis measured 1 cm (in length). The stenosis was       traversed. A TTS dilator was passed through the scope. Dilation with a       12-13.5-15 mm balloon dilator was performed to 13.5 mm. The dilation       site was examined and showed no bleeding, mucosal tear or perforation.      One benign-appearing, intrinsic severe stenosis was found 39 to 40 cm       from the incisors. This stenosis measured 9 mm (inner diameter) x less       than one cm (in length). The stenosis was traversed after dilation. A  TTS dilator was passed through the scope. Dilation with a 12-13.5-15 mm       balloon dilator was performed to 12 mm, 13.5 mm and 15 mm. The dilation       site was examined and showed mild mucosal disruption, moderate       improvement in luminal narrowing and no perforation. This was biopsied       with a cold forceps for evaluation of eosinophilic esophagitis. The       pathology specimen was placed into Bottle Number 1.      The Z-line was regular and was found 40 cm from the incisors.      Patchy mildly erythematous mucosa without bleeding was found in the       gastric antrum.       The exam of the stomach was otherwise normal.      The duodenal bulb and second portion of the duodenum were normal. Impression:               - Benign-appearing esophageal stenosis. Dilated.                           - Benign-appearing esophageal stenosis. Dilated.                            Biopsied.                           - Z-line regular, 40 cm from the incisors.                           - Erythematous mucosa in the antrum.                           - Normal duodenal bulb and second portion of the                            duodenum. Moderate Sedation:      Moderate (conscious) sedation was administered by the endoscopy nurse       and supervised by the endoscopist. The following parameters were       monitored: oxygen saturation, heart rate, blood pressure, CO2       capnography and response to care. Total physician intraservice time was       23 minutes. Recommendation:           - Patient has a contact number available for                            emergencies. The signs and symptoms of potential                            delayed complications were discussed with the                            patient. Return to normal activities tomorrow.                            Written discharge instructions were provided to the  patient.                           - Resume previous diet today.                           - Continue present medications.                           - No aspirin, ibuprofen, naproxen, or other                            non-steroidal anti-inflammatory drugs for 3 days.                           - Await pathology results.                           - Repeat upper endoscopy PRN.                           - Return to GI clinic in 6 months. Procedure Code(s):        --- Professional ---                           [phone], Esophagogastroduodenoscopy, flexible,                            transoral; with transendoscopic balloon dilation of                             esophagus (less than 30 mm diameter)                           43239, Esophagogastroduodenoscopy, flexible,                            transoral; with biopsy, single or multiple                           [id number], Moderate sedation services provided by the                            same physician or other qualified health care                            professional performing a gastrointestinal                            endoscopic service that sedation supports,                            requiring the presence of an independent trained                            observer to assist in the monitoring of the  patient's level of consciousness and physiological                            status; initial 15 minutes of intra-service time;                            patient age 39 years or older (additional time may                            be reported with 16109, as appropriate)                           [phone], Moderate sedation services provided by the                            same physician or other qualified health care                            professional performing the diagnostic or                            therapeutic service that the sedation supports,                            requiring the presence of an independent trained                            observer to assist in the monitoring of the                            patient's level of consciousness and physiological                            status; each additional 15 minutes intraservice                            time (List separately in addition to code for                            primary service) Diagnosis Code(s):        --- Professional ---                           K22.2, Esophageal obstruction                           K31.89, Other diseases of stomach and duodenum                           R13.14, Dysphagia, pharyngoesophageal phase                            K20.0, Eosinophilic esophagitis CPT copyright 2017 American [employer] Association. All rights reserved. The codes documented in this report are preliminary and upon coder review may  be revised to  meet current compliance requirements. Lionel DecemberNajeeb Glynn Freas, MD Lionel DecemberNajeeb Kenyette Gundy, MD 07/02/2017 8:11:42 AM This report has been signed electronically. Number of Addenda: 0

## 2017-07-02 NOTE — Discharge Instructions (Signed)
No aspirin or NSAIDs for 3 days. Resume usual medications as before. Resume usual diet. No driving for 24 hours. Physician will call with biopsy results. office visit in 6 months.   Upper Endoscopy, Care After Refer to this sheet in the next few weeks. These instructions provide you with information about caring for yourself after your procedure. Your health care provider may also give you more specific instructions. Your treatment has been planned according to current medical practices, but problems sometimes occur. Call your health care provider if you have any problems or questions after your procedure. What can I expect after the procedure? After the procedure, it is common to have:  A sore throat.  Bloating.  Nausea.  Follow these instructions at home:  Follow instructions from your health care provider about what to eat or drink after your procedure.  Return to your normal activities as told by your health care provider. Ask your health care provider what activities are safe for you.  Take over-the-counter and prescription medicines only as told by your health care provider.  Do not drive for 24 hours if you received a sedative.  Keep all follow-up visits as told by your health care provider. This is important. Contact a health care provider if:  You have a sore throat that lasts longer than one day.  You have trouble swallowing. Get help right away if:  You have a fever.  You vomit blood or your vomit looks like coffee grounds.  You have bloody, black, or tarry stools.  You have a severe sore throat or you cannot swallow.  You have difficulty breathing.  You have severe pain in your chest or belly. This information is not intended to replace advice given to you by your health care provider. Make sure you discuss any questions you have with your health care provider. Document Released: 07/15/2011 Document Revised: 06/21/2015 Document Reviewed:  10/26/2014 Elsevier Interactive Patient Education  [employer]2018 Elsevier Inc.

## 2017-07-09 ENCOUNTER — Encounter (HOSPITAL_COMMUNITY): Payer: Self-pay | Admitting: Internal Medicine

## 2017-12-08 ENCOUNTER — Ambulatory Visit (INDEPENDENT_AMBULATORY_CARE_PROVIDER_SITE_OTHER): Payer: 59 | Admitting: Internal Medicine

## 2018-01-05 ENCOUNTER — Ambulatory Visit (INDEPENDENT_AMBULATORY_CARE_PROVIDER_SITE_OTHER): Payer: 59 | Admitting: Internal Medicine

## 2018-01-12 ENCOUNTER — Encounter (INDEPENDENT_AMBULATORY_CARE_PROVIDER_SITE_OTHER): Payer: Self-pay | Admitting: Internal Medicine

## 2018-01-12 ENCOUNTER — Ambulatory Visit (INDEPENDENT_AMBULATORY_CARE_PROVIDER_SITE_OTHER): Payer: 59 | Admitting: Internal Medicine

## 2018-01-12 VITALS — BP 130/78 | HR 67 | Temp 98.5°F | Resp 18 | Ht 70.0 in | Wt 197.7 lb

## 2018-01-12 DIAGNOSIS — K200 Eosinophilic esophagitis: Secondary | ICD-10-CM

## 2018-01-12 MED ORDER — MONTELUKAST SODIUM 10 MG PO TABS: 10.0000 mg | ORAL_TABLET | Freq: Every day | ORAL | 3 refills | Status: DC

## 2018-01-12 NOTE — Progress Notes (Signed)
   Presenting complaint;  Follow-up for eosinophilic esophagitis and dysphagia.  Database and subjective:  Patient is 47 year old Caucasian male who has history of eosinophilic esophagitis dating back to 1996 when he underwent esophageal dilation.  He did fine until 2006 when his esophagus was re-dilated. He presented in March this year with dysphagia.  EGD revealed typical changes of EoE and distal esophageal stricture which was dilated to 12 mm.  He was treated with fluticasone for 6 weeks.  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  He returns for repeat dilation.  This time proximal stricture was dilated to 13.5 mm and distal esophageal stricture was dilated to 15 mm.  Biopsy on second EGD revealed resolution of EoE changes.  Patient states he is doing well.  While he has not had any episodes of food impaction.  He has had mild dysphagia with meats.  He chews food thoroughly and eat slowly.  He is not concerned when he eats his meal.  He has occasional heartburn.  He takes PPI on as-needed basis.  He has good appetite.  He denies nausea vomiting abdominal pain melena or rectal bleeding.   Current Medications: Outpatient Encounter Medications as of 01/12/2018  Medication Sig  . Cyanocobalamin 2500 MCG CHEW Chew 2,500 mcg by mouth daily.  . fluticasone (CUTIVATE) 0.05 % cream Apply 1 application topically daily as needed (for itch/red face. Mixed with Metronidazole.).  Marland Kitchen. metroNIDAZOLE (METROCREAM) 0.75 % cream Apply 1 application topically daily as needed (for itch/red face. Mixed with Fluticasone.).  Marland Kitchen. montelukast (SINGULAIR) 10 MG tablet Take 1 tablet (10 mg total) by mouth at bedtime.  Ailene Ards. Omega Fatty Acids-Vitamins (OMEGA-3 GUMMIES) CHEW Chew 1 each by mouth daily.  Marland Kitchen. omeprazole (PRILOSEC) 20 MG capsule Take 20 mg by mouth as needed.    No  facility-administered encounter medications on file as of 01/12/2018.      Objective: Blood pressure 130/78, pulse 67, temperature 98.5 F (36.9 C), temperature source Oral, resp. rate 18, height 5\' 10"  (1.778 m), weight 197 lb 11.2 oz (89.7 kg). Patient is alert and in no acute distress. Conjunctiva is pink. Sclera is nonicteric Oropharyngeal mucosa is normal. No neck masses or thyromegaly noted. Cardiac exam with regular rhythm normal S1 and S2. No murmur or gallop noted. Lungs are clear to auscultation. Abdomen is symmetrical soft and nontender with organomegaly or masses. No LE edema or clubbing noted.   Assessment:  #1.  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  Last dilation was in June 2019.  He is having mild dysphagia.  If dysphagia worsens he will return for repeat dilation.  #2.  Patient is average risk for CRC.  He is interested in screening when he turns 48.  Plan:  New prescription given for Montelukast 10 mg p.o. daily. Patient will call if dysphagia worsens in which case we will proceed with EGD with dilation. Office visit in 1 year.

## 2018-01-12 NOTE — Patient Instructions (Signed)
Notify if swallowing difficulty changes any.

## 2019-01-11 ENCOUNTER — Encounter (INDEPENDENT_AMBULATORY_CARE_PROVIDER_SITE_OTHER): Payer: Self-pay | Admitting: Internal Medicine

## 2019-01-11 ENCOUNTER — Ambulatory Visit (INDEPENDENT_AMBULATORY_CARE_PROVIDER_SITE_OTHER): Payer: 59 | Admitting: Internal Medicine

## 2019-01-11 ENCOUNTER — Other Ambulatory Visit: Payer: Self-pay

## 2019-01-11 ENCOUNTER — Other Ambulatory Visit (INDEPENDENT_AMBULATORY_CARE_PROVIDER_SITE_OTHER): Payer: Self-pay | Admitting: *Deleted

## 2019-01-11 VITALS — BP 152/98 | HR 71 | Temp 98.1°F | Ht 70.0 in | Wt 198.5 lb

## 2019-01-11 DIAGNOSIS — K200 Eosinophilic esophagitis: Secondary | ICD-10-CM

## 2019-01-11 DIAGNOSIS — R1310 Dysphagia, unspecified: Secondary | ICD-10-CM

## 2019-01-11 DIAGNOSIS — Z1211 Encounter for screening for malignant neoplasm of colon: Secondary | ICD-10-CM

## 2019-01-11 DIAGNOSIS — K219 Gastro-esophageal reflux disease without esophagitis: Secondary | ICD-10-CM

## 2019-01-11 DIAGNOSIS — R1319 Other dysphagia: Secondary | ICD-10-CM

## 2019-01-11 MED ORDER — MONTELUKAST SODIUM 10 MG PO TABS: 10.0000 mg | ORAL_TABLET | Freq: Every day | ORAL | 3 refills | Status: DC

## 2019-01-11 NOTE — Progress Notes (Signed)
Patient profile: Matthew Carr is a 48 y.o. male seen for evaluation of eosinophilic esophagitis. Last seen in clinic 1 year ago in 12/2017.   History of Present Illness: Matthew Carr is seen today for history of eosinophilic esophagitis, initial diagnosis was 1996 and had dilation at that time, did well until 2006 when had re-dilation. In 2019 had recurrent dilations for dyspahgia in 03/2017 and 06/2017.  Also completed 6 week course of fluticasone after 03/2017 EGD and biopsies on June 2019 EGD were negative.  Today reports he has had return of mild dysphagia over past year which is not as severe as symptoms were in 2019.  He reports dry textures such as crackers, chicken, etc pass down slower.  He is not having any episodes of severe dysphagia requiring regurgitation.  He has no issues with liquids.  He generally avoids large pills as these have been problematic in the past (takes gummy vitamins, etc).  He has occasional GERD symptoms depending on diet, he is currently taking omeprazole 20 mg 1-2 times a month.  Does use preventatively if knows he is going to eat greasy foods later in evening.  He denies any nausea vomiting or abdominal pain.  He has not noted specific food triggers except possibly milk shakes but can tolerate cereal and milk without worsening dysphagia. NSAID use no more than 1x/month.   Patient denies any constipation, diarrhea, rectal bleeding, lower abdominal pain.  Move stool daily    Wt Readings from Last 3 Encounters:  01/11/19 198 lb 8 oz (90 kg)  01/12/18 197 lb 11.2 oz (89.7 kg)  07/02/17 190 lb (86.2 kg)     Last Colonoscopy: None prior  Last Endoscopy:  Endoscopy March 2019-severe intrinsic stenosis at 23cm, 8 mm x 2 cm dilated to 12 mm balloon. Mucosal changes including ringed esophagus with furrows. Regular Z-line. Path with eosinophil count greater than 30 per high-power field  Endoscopy June 2019-benign esophageal stenosis 22 to 23 cm, dilation 13.5 mm.  Severe stenosis 39 to 40 cm, dilated 12 to 15 mm balloon. Path with no intraepithelial eosinophils   Past Medical History:  Past Medical History:  Diagnosis Date  . GERD (gastroesophageal reflux disease)   . PONV (postoperative nausea and vomiting)     Problem List: Patient Active Problem List   Diagnosis Date Noted  . Eosinophilic esophagitis 40/08/6759  . Dysphagia 06/02/2017  . Esophageal stricture 04/03/2017  . Esophageal dysphagia 04/03/2017  . GERD (gastroesophageal reflux disease) 03/28/2016    Past Surgical History: Past Surgical History:  Procedure Laterality Date  . ESOPHAGEAL DILATION N/A 04/10/2017   Procedure: ESOPHAGEAL DILATION;  Surgeon: Matthew Houston, MD;  Location: [facility];  Service: Endoscopy;  Laterality: N/A;  . ESOPHAGEAL DILATION N/A 07/02/2017   Procedure: ESOPHAGEAL DILATION;  Surgeon: Matthew Houston, MD;  Location: [facility];  Service: Endoscopy;  Laterality: N/A;  . ESOPHAGOGASTRODUODENOSCOPY N/A 04/10/2017   Procedure: ESOPHAGOGASTRODUODENOSCOPY (EGD);  Surgeon: Matthew Houston, MD;  Location: [facility];  Service: Endoscopy;  Laterality: N/A;  . ESOPHAGOGASTRODUODENOSCOPY N/A 07/02/2017   Procedure: ESOPHAGOGASTRODUODENOSCOPY (EGD);  Surgeon: Matthew Houston, MD;  Location: [facility];  Service: Endoscopy;  Laterality: N/A;  730    Allergies: No Known Allergies    Home Medications:  Current Outpatient Medications:  .  APPLE CIDER VINEGAR PO, Take by mouth 2 (two) times daily. Per patient this is gummies, Disp: , Rfl:  .  Cyanocobalamin 2500 MCG CHEW, Chew 2,500  mcg by mouth daily., Disp: , Rfl:  .  fluticasone (CUTIVATE) 0.05 % cream, Apply 1 application topically daily as needed (for itch/red face. Mixed with Metronidazole.)., Disp: , Rfl:  .  metroNIDAZOLE (METROCREAM) 0.75 % cream, Apply 1 application topically daily as needed (for itch/red face. Mixed with Fluticasone.)., Disp: , Rfl:  .  montelukast (SINGULAIR) 10 MG tablet,  Take 1 tablet (10 mg total) by mouth at bedtime., Disp: 90 tablet, Rfl: 3 .  Multiple Vitamins-Minerals (VITAMIN D3 COMPLETE PO), Take by mouth daily., Disp: , Rfl:  .  Omega Fatty Acids-Vitamins (OMEGA-3 GUMMIES) CHEW, Chew 1 each by mouth daily., Disp: , Rfl:  .  omeprazole (PRILOSEC) 20 MG capsule, Take 20 mg by mouth as needed. , Disp: , Rfl:  .  vitamin C (ASCORBIC ACID) 500 MG tablet, Take 1,000 mg by mouth daily. , Disp: , Rfl:    Family History: family history includes CAD in his paternal grandfather; Hypertension in his mother.  The patient's family history is negative for colon polyps or colon cancer  Social History:   reports that he has never smoked. He has never used smokeless tobacco. He reports that he does not drink alcohol or use drugs. The patient denies ETOH, tobacco, or drug use.   Review of Systems: Constitutional: Denies weight loss/weight gain  Eyes: No changes in vision. ENT: No oral lesions, sore throat.  GI: see HPI.  Heme/Lymph: No easy bruising.  CV: No chest pain.  GU: No hematuria.  Integumentary: No rashes.  Neuro: No headaches.  Psych: No depression/anxiety.  Endocrine: No heat/cold intolerance.  Allergic/Immunologic: No urticaria.  Resp: No cough, SOB.  Musculoskeletal: No joint swelling.    Physical Examination: BP (!) 152/98 (BP Location: Right Arm, Patient Position: Sitting, Cuff Size: Normal)   Pulse 71   Temp 98.1 F (36.7 C) (Oral)   Ht 5\' 10"  (1.778 m)   Wt 198 lb 8 oz (90 kg)   BMI 28.48 kg/m  Gen: NAD, alert and oriented x 4 HEENT: PEERLA, EOMI, Neck: supple, no JVD Chest: CTA bilaterally, no wheezes, crackles, or other adventitious sounds CV: RRR, no m/g/c/r Abd: soft, NT, ND, +BS in all four quadrants; no HSM, guarding, ridigity, or rebound tenderness Ext: no edema, well perfused with 2+ pulses, Skin: no rash or lesions noted on observed skin Lymph: no noted LAD  Data:  September 2020-glucose and lipid panel reviewed in Care  Everywhere, unremarkable except LDL 122. No recent CBC/CMP available.    Assessment/Plan: Matthew Carr is a 48 y.o. male     [name] was seen today for follow-up.  Diagnoses and all orders for this visit:  Gastroesophageal reflux disease, unspecified whether esophagitis present  Eosinophilic esophagitis  Esophageal dysphagia  Other orders -     montelukast (SINGULAIR) 10 MG tablet; Take 1 tablet (10 mg total) by mouth at bedtime.     1.  Dysphagia/eosinophilic esophagitis-mild symptoms at this time, will plan for endoscopy to assess for recurrent stricture/EoE.  He is to contact Barbara Cower if symptoms worsen prior to EGD and would consider course of fluticasone in interim.  He will continue small bites, etc.  If significant erosions on EGD consider long-term low-dose PPI but can continue using as needed at this time. Singular refilled to continue.   2. Colon cancer screening-colonoscopy due based on age. No GI symptoms or family hx. Scheduled at time of endoscopy  *Reports history of postop nausea vomiting after prior EGDs, consider dose of IV Phenergan prior  to sedation.    I personally performed the service, non-incident to. (WP)  Tawni PummelJanice Omarion Minnehan, [facility]

## 2019-01-11 NOTE — Patient Instructions (Signed)
Esophagogastroduodenoscopy with esophageal dilation and colonoscopy to be scheduled near future.

## 2019-01-14 DIAGNOSIS — Z1211 Encounter for screening for malignant neoplasm of colon: Secondary | ICD-10-CM | POA: Insufficient documentation

## 2019-01-17 ENCOUNTER — Telehealth (INDEPENDENT_AMBULATORY_CARE_PROVIDER_SITE_OTHER): Payer: Self-pay | Admitting: *Deleted

## 2019-01-17 ENCOUNTER — Encounter (INDEPENDENT_AMBULATORY_CARE_PROVIDER_SITE_OTHER): Payer: Self-pay | Admitting: *Deleted

## 2019-01-17 MED ORDER — SUPREP BOWEL PREP KIT 17.5-3.13-1.6 GM/177ML PO SOLN: 1.0000 | Freq: Once | ORAL | 0 refills | Status: AC

## 2019-01-17 NOTE — Telephone Encounter (Signed)
Patient needs suprep (copay card mailed with instructions) TCS/EGD sch'd 2/3

## 2019-02-15 ENCOUNTER — Other Ambulatory Visit (INDEPENDENT_AMBULATORY_CARE_PROVIDER_SITE_OTHER): Payer: Self-pay | Admitting: *Deleted

## 2019-02-15 DIAGNOSIS — Z1211 Encounter for screening for malignant neoplasm of colon: Secondary | ICD-10-CM

## 2019-02-15 DIAGNOSIS — R1310 Dysphagia, unspecified: Secondary | ICD-10-CM

## 2019-02-15 DIAGNOSIS — K200 Eosinophilic esophagitis: Secondary | ICD-10-CM

## 2019-02-25 ENCOUNTER — Other Ambulatory Visit: Payer: Self-pay

## 2019-02-25 ENCOUNTER — Encounter (HOSPITAL_COMMUNITY): Payer: Self-pay

## 2019-02-28 ENCOUNTER — Other Ambulatory Visit: Payer: Self-pay

## 2019-02-28 ENCOUNTER — Other Ambulatory Visit (HOSPITAL_COMMUNITY): Payer: 59

## 2019-02-28 ENCOUNTER — Encounter (HOSPITAL_COMMUNITY)
Admission: RE | Admit: 2019-02-28 | Discharge: 2019-02-28 | Disposition: A | Discharge status: Home / Self Care | Payer: 59 | Source: Ambulatory Visit | Attending: Internal Medicine | Admitting: Internal Medicine

## 2019-02-28 ENCOUNTER — Other Ambulatory Visit (HOSPITAL_COMMUNITY)
Admission: RE | Admit: 2019-02-28 | Discharge: 2019-02-28 | Disposition: A | Discharge status: Home / Self Care | Payer: 59 | Source: Ambulatory Visit | Attending: Internal Medicine | Admitting: Internal Medicine

## 2019-02-28 DIAGNOSIS — Z01812 Encounter for preprocedural laboratory examination: Secondary | ICD-10-CM | POA: Diagnosis not present

## 2019-02-28 DIAGNOSIS — Z20822 Contact with and (suspected) exposure to covid-19: Secondary | ICD-10-CM | POA: Diagnosis not present

## 2019-02-28 HISTORY — DX: Unspecified glaucoma: H40.9

## 2019-02-28 LAB — SARS CORONAVIRUS 2 (TAT 6-24 HRS): SARS Coronavirus 2: NEGATIVE

## 2019-03-02 ENCOUNTER — Encounter (HOSPITAL_COMMUNITY)
Admission: RE | Disposition: A | Discharge status: Home / Self Care | Payer: Self-pay | Source: Home / Self Care | Attending: Internal Medicine

## 2019-03-02 ENCOUNTER — Encounter (HOSPITAL_COMMUNITY): Payer: Self-pay | Admitting: Internal Medicine

## 2019-03-02 ENCOUNTER — Ambulatory Visit (HOSPITAL_COMMUNITY): Payer: 59 | Admitting: Anesthesiology

## 2019-03-02 ENCOUNTER — Ambulatory Visit (HOSPITAL_COMMUNITY)
Admission: RE | Admit: 2019-03-02 | Discharge: 2019-03-02 | Disposition: A | Discharge status: Home / Self Care | Payer: 59 | Attending: Internal Medicine | Admitting: Internal Medicine

## 2019-03-02 DIAGNOSIS — R1314 Dysphagia, pharyngoesophageal phase: Secondary | ICD-10-CM

## 2019-03-02 DIAGNOSIS — Z09 Encounter for follow-up examination after completed treatment for conditions other than malignant neoplasm: Secondary | ICD-10-CM | POA: Diagnosis not present

## 2019-03-02 DIAGNOSIS — Q394 Esophageal web: Secondary | ICD-10-CM

## 2019-03-02 DIAGNOSIS — Z1211 Encounter for screening for malignant neoplasm of colon: Secondary | ICD-10-CM | POA: Insufficient documentation

## 2019-03-02 DIAGNOSIS — K220 Achalasia of cardia: Secondary | ICD-10-CM

## 2019-03-02 DIAGNOSIS — K200 Eosinophilic esophagitis: Secondary | ICD-10-CM | POA: Diagnosis not present

## 2019-03-02 DIAGNOSIS — R1310 Dysphagia, unspecified: Secondary | ICD-10-CM

## 2019-03-02 DIAGNOSIS — K222 Esophageal obstruction: Secondary | ICD-10-CM | POA: Insufficient documentation

## 2019-03-02 HISTORY — PX: ESOPHAGOGASTRODUODENOSCOPY (EGD) WITH PROPOFOL: SHX5813

## 2019-03-02 HISTORY — PX: BALLOON DILATION: SHX5330

## 2019-03-02 HISTORY — PX: BIOPSY: SHX5522

## 2019-03-02 HISTORY — PX: COLONOSCOPY WITH PROPOFOL: SHX5780

## 2019-03-02 SURGERY — ESOPHAGOGASTRODUODENOSCOPY (EGD) WITH PROPOFOL
Anesthesia: General

## 2019-03-02 SURGERY — EGD (ESOPHAGOGASTRODUODENOSCOPY)
Anesthesia: Moderate Sedation

## 2019-03-02 MED ORDER — KETOROLAC TROMETHAMINE 0.5 % OP SOLN
1.0000 [drp] | Freq: Three times a day (TID) | OPHTHALMIC | Status: DC | PRN
  Filled 2019-03-02: qty 5

## 2019-03-02 MED ORDER — KETAMINE HCL 50 MG/5ML IJ SOSY
PREFILLED_SYRINGE | INTRAMUSCULAR | Status: AC
  Filled 2019-03-02: qty 5

## 2019-03-02 MED ORDER — HYDROCODONE-ACETAMINOPHEN 7.5-325 MG PO TABS: 1.0000 | ORAL_TABLET | Freq: Once | ORAL | Status: DC | PRN

## 2019-03-02 MED ORDER — MIDAZOLAM HCL 2 MG/2ML IJ SOLN: 0.5000 mg | Freq: Once | INTRAMUSCULAR | Status: DC | PRN

## 2019-03-02 MED ORDER — LIDOCAINE HCL (CARDIAC) PF 100 MG/5ML IV SOSY
PREFILLED_SYRINGE | INTRAVENOUS | Status: DC | PRN
  Administered 2019-03-02: 50 mg via INTRATRACHEAL

## 2019-03-02 MED ORDER — POLYMYXIN B-TRIMETHOPRIM 10000-0.1 UNIT/ML-% OP SOLN
1.0000 [drp] | Freq: Three times a day (TID) | OPHTHALMIC | Status: DC
  Filled 2019-03-02: qty 10

## 2019-03-02 MED ORDER — PROPOFOL 10 MG/ML IV BOLUS
INTRAVENOUS | Status: DC | PRN
  Administered 2019-03-02: 30 mg via INTRAVENOUS

## 2019-03-02 MED ORDER — SCOPOLAMINE 1 MG/3DAYS TD PT72
MEDICATED_PATCH | TRANSDERMAL | Status: AC
  Filled 2019-03-02: qty 1

## 2019-03-02 MED ORDER — LACTATED RINGERS IV SOLN: INTRAVENOUS | Status: DC

## 2019-03-02 MED ORDER — EPHEDRINE SULFATE-NACL 50-0.9 MG/10ML-% IV SOSY
PREFILLED_SYRINGE | INTRAVENOUS | Status: DC | PRN
  Administered 2019-03-02 (×2): 5 mg via INTRAVENOUS

## 2019-03-02 MED ORDER — HYDROMORPHONE HCL 1 MG/ML IJ SOLN: 0.2500 mg | INTRAMUSCULAR | Status: DC | PRN

## 2019-03-02 MED ORDER — CHLORHEXIDINE GLUCONATE CLOTH 2 % EX PADS: 6.0000 | MEDICATED_PAD | Freq: Once | CUTANEOUS | Status: DC

## 2019-03-02 MED ORDER — SCOPOLAMINE 1 MG/3DAYS TD PT72
1.0000 | MEDICATED_PATCH | TRANSDERMAL | Status: DC
  Administered 2019-03-02: 1.5 mg via TRANSDERMAL

## 2019-03-02 MED ORDER — PROMETHAZINE HCL 25 MG/ML IJ SOLN: 6.2500 mg | INTRAMUSCULAR | Status: DC | PRN

## 2019-03-02 MED ORDER — LACTATED RINGERS IV SOLN: INTRAVENOUS | Status: DC | PRN

## 2019-03-02 MED ORDER — BSS IO SOLN
15.0000 mL | Freq: Once | INTRAOCULAR | Status: DC
  Filled 2019-03-02: qty 15

## 2019-03-02 MED ORDER — KETAMINE HCL 10 MG/ML IJ SOLN
INTRAMUSCULAR | Status: DC | PRN
  Administered 2019-03-02: 10 mg via INTRAVENOUS
  Administered 2019-03-02: 30 mg via INTRAVENOUS

## 2019-03-02 MED ORDER — PROPOFOL 500 MG/50ML IV EMUL
INTRAVENOUS | Status: DC | PRN
  Administered 2019-03-02: 125 ug/kg/min via INTRAVENOUS

## 2019-03-02 NOTE — Op Note (Signed)
[facility] Patient Name: Matthew Carr Procedure Date: 03/02/2019 8:48 AM MRN: [id number] Date of Birth: 20-May-1970 Attending MD: Lionel December , MD CSN: [id number] Age: 49 Admit Type: Outpatient Procedure:                Colonoscopy Indications:              Screening for colorectal malignant neoplasm Providers:                Lionel December, MD, Loma Messing B. Patsy Lager, RN, Pandora Leiter, Technician Referring MD:             Elfredia Nevins, MD Medicines:                Propofol per Anesthesia Complications:            No immediate complications. Estimated Blood Loss:     Estimated blood loss: none. Procedure:                Pre-Anesthesia Assessment:                           - Prior to the procedure, a History and Physical                            was performed, and patient medications and                            allergies were reviewed. The patient's tolerance of                            previous anesthesia was also reviewed. The risks                            and benefits of the procedure and the sedation                            options and risks were discussed with the patient.                            All questions were answered, and informed consent                            was obtained. Prior Anticoagulants: The patient has                            taken no previous anticoagulant or antiplatelet                            agents. ASA Grade Assessment: II - A patient with                            mild systemic disease. After reviewing the risks  and benefits, the patient was deemed in                            satisfactory condition to undergo the procedure.                           After obtaining informed consent, the colonoscope                            was passed under direct vision. Throughout the                            procedure, the patient's blood pressure, pulse, and        oxygen saturations were monitored continuously. The                            [id number]-[id number] ([id number]) scope was introduced through                            the anus and advanced to the the cecum, identified                            by appendiceal orifice and ileocecal valve. The                            colonoscopy was performed without difficulty. The                            patient tolerated the procedure well. The quality                            of the bowel preparation was excellent. The                            ileocecal valve, appendiceal orifice, and rectum                            were photographed. Scope In: 8:50:37 AM Scope Out: 9:01:46 AM Scope Withdrawal Time: 0 hours 6 minutes 53 seconds  Total Procedure Duration: 0 hours 11 minutes 9 seconds  Findings:      The perianal and digital rectal examinations were normal.      The colon (entire examined portion) appeared normal.      The retroflexed view of the distal rectum and anal verge was normal and       showed no anal or rectal abnormalities. Impression:               - The entire examined colon is normal.                           - The distal rectum and anal verge are normal on                            retroflexion view.                           -  No specimens collected. Moderate Sedation:      Per Anesthesia Care Recommendation:           - Patient has a contact number available for                            emergencies. The signs and symptoms of potential                            delayed complications were discussed with the                            patient. Return to normal activities tomorrow.                            Written discharge instructions were provided to the                            patient.                           - Resume previous diet today.                           - Continue present medications.                           - Repeat colonoscopy in 10 years for screening                             purposes.                           - See the other procedure note for documentation of                            additional recommendations. Procedure Code(s):        --- Professional ---                           [phone], Colonoscopy, flexible; diagnostic, including                            collection of specimen(s) by brushing or washing,                            when performed (separate procedure) Diagnosis Code(s):        --- Professional ---                           Z12.11, Encounter for screening for malignant                            neoplasm of colon CPT copyright 2019 [employer]. All rights reserved. The codes documented in this report are preliminary and upon coder review may  be revised to meet current compliance requirements. Hildred Laser, MD Hildred Laser, MD  03/02/2019 9:15:58 AM This report has been signed electronically. Number of Addenda: 0

## 2019-03-02 NOTE — Anesthesia Postprocedure Evaluation (Signed)
Anesthesia Post Note  Patient: JABRE HEO  Procedure(s) Performed: ESOPHAGOGASTRODUODENOSCOPY (EGD) WITH PROPOFOL (N/A ) COLONOSCOPY WITH PROPOFOL (N/A ) BIOPSY BALLOON DILATION (N/A )  Patient location during evaluation: PACU Anesthesia Type: General Level of consciousness: awake Pain management: pain level controlled Vital Signs Assessment: post-procedure vital signs reviewed and stable Respiratory status: spontaneous breathing and nonlabored ventilation Cardiovascular status: stable Postop Assessment: no apparent nausea or vomiting Anesthetic complications: no     Last Vitals:  Vitals:   03/02/19 0749  BP: 131/90  Resp: 20  Temp: 36.5 C  SpO2: 98%    Last Pain:  Vitals:   03/02/19 0827  TempSrc:   PainSc: 0-No pain                 Sourish Allender Hristova

## 2019-03-02 NOTE — Anesthesia Postprocedure Evaluation (Signed)
Anesthesia Post Note  Patient: Matthew Carr  Procedure(s) Performed: ESOPHAGOGASTRODUODENOSCOPY (EGD) WITH PROPOFOL (N/A ) COLONOSCOPY WITH PROPOFOL (N/A ) BIOPSY BALLOON DILATION (N/A )  Patient location during evaluation: Phase II Anesthesia Type: General Level of consciousness: awake and awake and alert Pain management: pain level controlled Vital Signs Assessment: post-procedure vital signs reviewed and stable Respiratory status: spontaneous breathing Cardiovascular status: blood pressure returned to baseline Postop Assessment: no headache Comments: Pt c/o L eye pain/redness in PACU probable Corneal abraison -ordered corneal abraision protocol  L eye patch -  Explained treastment options and that should resolve over the next few days Told to call if having any visual changes or if he desires to be seen  -voiced understanding      Last Vitals:  Vitals:   03/02/19 0930 03/02/19 0939  BP:  130/86  Pulse: 64 64  Resp: 16   Temp:  (!) 36.4 C  SpO2: 100% 96%    Last Pain:  Vitals:   03/02/19 0939  TempSrc: Oral  PainSc: 0-No pain                 Fara Boros Dilraj Killgore

## 2019-03-02 NOTE — Op Note (Addendum)
[facility] Patient Name: Matthew Carr Procedure Date: 03/02/2019 8:00 AM MRN: [id number] Date of Birth: 1970/12/01 Attending MD: Lionel December , MD CSN: [id number] Age: 49 Admit Type: Outpatient Procedure:                Upper GI endoscopy Indications:              Esophageal dysphagia, Eosinophilic esophagitis,                            Follow-up of eosinophilic esophagitis, For therapy                            of eosinophilic esophagitis Providers:                Lionel December, MD, Loma Messing B. Patsy Lager, RN, Pandora Leiter, Technician Referring MD:             Elfredia Nevins, MD Medicines:                Propofol per Anesthesia Complications:            No immediate complications. Estimated Blood Loss:     Estimated blood loss was minimal. Procedure:                Pre-Anesthesia Assessment:                           - Prior to the procedure, a History and Physical                            was performed, and patient medications and                            allergies were reviewed. The patient's tolerance of                            previous anesthesia was also reviewed. The risks                            and benefits of the procedure and the sedation                            options and risks were discussed with the patient.                            All questions were answered, and informed consent                            was obtained. Prior Anticoagulants: The patient has                            taken no previous anticoagulant or antiplatelet  agents. ASA Grade Assessment: II - A patient with                            mild systemic disease. After reviewing the risks                            and benefits, the patient was deemed in                            satisfactory condition to undergo the procedure.                           After obtaining informed consent, the endoscope was                             passed under direct vision. Throughout the                            procedure, the patient's blood pressure, pulse, and                            oxygen saturations were monitored continuously. The                            [id number]-H190 ([id number]) scope was introduced through the                            mouth, and advanced to the second part of duodenum.                            The upper GI endoscopy was accomplished without                            difficulty. The patient tolerated the procedure                            well. Scope In: 8:30:45 AM Scope Out: 8:45:32 AM Total Procedure Duration: 0 hours 14 minutes 47 seconds  Findings:      The hypopharynx was normal.      A web was found in the proximal esophagus. This was biopsied with a cold       forceps for histology. The pathology specimen was placed into Bottle       Number 1.      Mucosal changes including ringed esophagus, longitudinal furrows and       small-caliber esophagus were found in the entire esophagus. Biopsies       were taken with a cold forceps for histology. The pathology specimen was       placed into Bottle Number 1.      One benign-appearing, intrinsic moderate stenosis was found 39 to 40 cm       from the incisors. This stenosis measured 1 cm (in length). The dilation       site was examined and showed mild mucosal disruption, moderate       improvement in luminal narrowing and no perforation.  The Z-line was regular and was found 40 cm from the incisors.      The entire examined stomach was normal.      The duodenal bulb and second portion of the duodenum were normal. Impression:               - Normal hypopharynx.                           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                            Disrupted with cold biopsied.                           - Esophageal mucosal changes secondary to                            eosinophilic esophagitis. Biopsied post dilation.                            - Benign-appearing distal esophageal stenosis                            extending into GEJ.                           - Z-line regular, 40 cm from the incisors.                           - Normal stomach.                           - Normal duodenal bulb and second portion of the                            duodenum. Moderate Sedation:      Per Anesthesia Care Recommendation:           - Patient has a contact number available for                            emergencies. The signs and symptoms of potential                            delayed complications were discussed with the                            patient. Return to normal activities tomorrow.                            Written discharge instructions were provided to the                            patient.                           - Resume previous diet today.                           -  Continue present medications.                           - No aspirin, ibuprofen, naproxen, or other                            non-steroidal anti-inflammatory drugs for 3 days.                           - Await pathology results.                           - Repeat upper endoscopy PRN. Procedure Code(s):        --- Professional ---                           [phone], Esophagogastroduodenoscopy, flexible,                            transoral; with biopsy, single or multiple Diagnosis Code(s):        --- Professional ---                           Q39.4, Esophageal web                           U04.5, Eosinophilic esophagitis                           K22.2, Esophageal obstruction                           R13.14, Dysphagia, pharyngoesophageal phase CPT copyright 2019 American Medical Association. All rights reserved. The codes documented in this report are preliminary and upon coder review may  be revised to meet current compliance requirements. Hildred Laser, MD Hildred Laser, MD 03/02/2019 9:13:14 AM This report has been signed  electronically. Number of Addenda: 1 Addendum Number: 1   Addendum Date: 03/04/2019 1:48:36 PM      distal esophageal stricture dilated to 15 mm with balloon dilator. Hildred Laser, MD Hildred Laser, MD 03/04/2019 1:49:21 PM This report has been signed electronically.

## 2019-03-02 NOTE — Anesthesia Preprocedure Evaluation (Signed)
Anesthesia Evaluation  General Assessment Comment:H/o PONV in past -requests Scop patch -had one in past   History of Anesthesia Complications (+) PONV and history of anesthetic complications  Airway Mallampati: II  TM Distance: >3 FB Neck ROM: Full    Dental no notable dental hx. (+) Teeth Intact   Pulmonary  Denies all pulm issues - states hasn't used the singulair in over a year    Pulmonary exam normal breath sounds clear to auscultation       Cardiovascular Exercise Tolerance: Good Normal cardiovascular examI Rhythm:Regular Rate:Normal     Neuro/Psych    GI/Hepatic GERD  Medicated and Controlled,Here for EGD/Colon and poss dilation  H/o eosinophillic Esophagitis in past    Endo/Other    Renal/GU      Musculoskeletal   Abdominal   Peds  Hematology   Anesthesia Other Findings   Reproductive/Obstetrics                             Anesthesia Physical Anesthesia Plan  ASA: II  Anesthesia Plan: General   Post-op Pain Management:    Induction: Intravenous  PONV Risk Score and Plan: 3 and Ondansetron, Propofol infusion, TIVA, Scopolamine patch - Pre-op and Treatment may vary due to age or medical condition  Airway Management Planned: Simple Face Mask and Nasal Cannula  Additional Equipment:   Intra-op Plan:   Post-operative Plan:   Informed Consent: I have reviewed the patients History and Physical, chart, labs and discussed the procedure including the risks, benefits and alternatives for the proposed anesthesia with the patient or authorized representative who has indicated his/her understanding and acceptance.     Dental advisory given  Plan Discussed with: CRNA  Anesthesia Plan Comments: (Plan Full PPE use  Plan GA with GETA as needed d/w pt -WTP with same after Q&A)        Anesthesia Quick Evaluation

## 2019-03-02 NOTE — Discharge Instructions (Addendum)
No aspirin or NSAIDs for 72 hours. Resume usual medications as before Resume usual diet. No driving for 24 hours. Physician will call with biopsy results.     Upper Endoscopy, Adult, Care After This sheet gives you information about how to care for yourself after your procedure. Your health care provider may also give you more specific instructions. If you have problems or questions, contact your health care provider. What can I expect after the procedure? After the procedure, it is common to have:  A sore throat.  Mild stomach pain or discomfort.  Bloating.  Nausea. Follow these instructions at home:   Follow instructions from your health care provider about what to eat or drink after your procedure.  Return to your normal activities as told by your health care provider. Ask your health care provider what activities are safe for you.  Take over-the-counter and prescription medicines only as told by your health care provider.  Do not drive for 24 hours if you were given a sedative during your procedure.  Keep all follow-up visits as told by your health care provider. This is important. Contact a health care provider if you have:  A sore throat that lasts longer than one day.  Trouble swallowing. Get help right away if:  You vomit blood or your vomit looks like coffee grounds.  You have: ? A fever. ? Bloody, black, or tarry stools. ? A severe sore throat or you cannot swallow. ? Difficulty breathing. ? Severe pain in your chest or abdomen. Summary  After the procedure, it is common to have a sore throat, mild stomach discomfort, bloating, and nausea.  Do not drive for 24 hours if you were given a sedative during the procedure.  Follow instructions from your health care provider about what to eat or drink after your procedure.  Return to your normal activities as told by your health care provider. This information is not intended to replace advice given to you  by your health care provider. Make sure you discuss any questions you have with your health care provider. Document Revised: 07/07/2017 Document Reviewed: 06/15/2017 Elsevier Patient Education  2020 [employer].  Colonoscopy, Adult, Care After This sheet gives you information about how to care for yourself after your procedure. Your doctor may also give you more specific instructions. If you have problems or questions, call your doctor. What can I expect after the procedure? After the procedure, it is common to have:  A small amount of blood in your poop (stool) for 24 hours.  Some gas.  Mild cramping or bloating in your belly (abdomen). Follow these instructions at home: Eating and drinking   Drink enough fluid to keep your pee (urine) pale yellow.  Follow instructions from your doctor about what you cannot eat or drink.  Return to your normal diet as told by your doctor. Avoid heavy or fried foods that are hard to digest. Activity  Rest as told by your doctor.  Do not sit for a long time without moving. Get up to take short walks every 1-2 hours. This is important. Ask for help if you feel weak or unsteady.  Return to your normal activities as told by your doctor. Ask your doctor what activities are safe for you. To help cramping and bloating:   Try walking around.  Put heat on your belly as told by your doctor. Use the heat source that your doctor recommends, such as a moist heat pack or a heating pad. ? Put  a towel between your skin and the heat source. ? Leave the heat on for 20-30 minutes. ? Remove the heat if your skin turns bright red. This is very important if you are unable to feel pain, heat, or cold. You may have a greater risk of getting burned. General instructions  For the first 24 hours after the procedure: ? Do not drive or use machinery. ? Do not sign important documents. ? Do not drink alcohol. ? Do your daily activities more slowly than  normal. ? Eat foods that are soft and easy to digest.  Take over-the-counter or prescription medicines only as told by your doctor.  Keep all follow-up visits as told by your doctor. This is important. Contact a doctor if:  You have blood in your poop 2-3 days after the procedure. Get help right away if:  You have more than a small amount of blood in your poop.  You see large clumps of tissue (blood clots) in your poop.  Your belly is swollen.  You feel like you may vomit (nauseous).  You vomit.  You have a fever.  You have belly pain that gets worse, and medicine does not help your pain. Summary  After the procedure, it is common to have a small amount of blood in your poop. You may also have mild cramping and bloating in your belly.  For the first 24 hours after the procedure, do not drive or use machinery, do not sign important documents, and do not drink alcohol.  Get help right away if you have a lot of blood in your poop, feel like you may vomit, have a fever, or have more belly pain. This information is not intended to replace advice given to you by your health care provider. Make sure you discuss any questions you have with your health care provider. Document Revised: 08/09/2018 Document Reviewed: 08/09/2018 Elsevier Patient Education  [place].

## 2019-03-02 NOTE — Transfer of Care (Signed)
Immediate Anesthesia Transfer of Care Note  Patient: Matthew Carr  Procedure(s) Performed: ESOPHAGOGASTRODUODENOSCOPY (EGD) WITH PROPOFOL (N/A ) COLONOSCOPY WITH PROPOFOL (N/A ) BIOPSY BALLOON DILATION (N/A )  Patient Location: PACU  Anesthesia Type:General  Level of Consciousness: awake  Airway & Oxygen Therapy: Patient Spontanous Breathing  Post-op Assessment: Report given to RN and Post -op Vital signs reviewed and stable  Post vital signs: Reviewed and stable  Last Vitals:  Vitals Value Taken Time  BP    Temp    Pulse 82 03/02/19 0910  Resp 11 03/02/19 0910  SpO2 98 % 03/02/19 0910  Vitals shown include unvalidated device data.  Last Pain:  Vitals:   03/02/19 0827  TempSrc:   PainSc: 0-No pain      Patients Stated Pain Goal: 9 (03/02/19 0749)  Complications: No apparent anesthesia complications

## 2019-03-02 NOTE — Progress Notes (Signed)
Pt presented to post-op with a left eye corneal abrasion. Left eye redness. Eye patch was placed to left eye with paper tape for securing. Ketorolac eye drops ordered per Dr. Lemont Fillers. RN instructed pt/ wife to use 1 drop to left eye every 8 hours as needed. Pt verbalized understanding.

## 2019-03-02 NOTE — H&P (Signed)
Matthew Carr is an 49 y.o. male.   Chief Complaint: Patient is here for esophagogastroduodenoscopy vaginal dilation and colonoscopy. 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  He also has history of GERD.  Heartburn is well controlled with therapy.  His last EGD ED was in June 2019.  He has good appetite and has maintained his weight.  He states he is a slow eater and chews her food thoroughly.  He is also undergoing screening colonoscopy.  He denies abdominal pain change in bowel habits or rectal bleeding. Family history is negative for CRC.  Past Medical History:  Diagnosis Date  . GERD (gastroesophageal reflux disease)   . Glaucoma   . PONV (postoperative nausea and vomiting)     Past Surgical History:  Procedure Laterality Date  . ESOPHAGEAL DILATION N/A 04/10/2017   Procedure: ESOPHAGEAL DILATION;  Surgeon: Rogene Houston, MD;  [facility];  Service: Endoscopy;  Laterality: N/A;  . ESOPHAGEAL DILATION N/A 07/02/2017   Procedure: ESOPHAGEAL DILATION;  Surgeon: Rogene Houston, MD;  [facility];  Service: Endoscopy;  Laterality: N/A;  . ESOPHAGOGASTRODUODENOSCOPY N/A 04/10/2017   Procedure: ESOPHAGOGASTRODUODENOSCOPY (EGD);  Surgeon: Rogene Houston, MD;  [facility]: [facility];  Service: Endoscopy;  Laterality: N/A;  . ESOPHAGOGASTRODUODENOSCOPY N/A 07/02/2017   Procedure: ESOPHAGOGASTRODUODENOSCOPY (EGD);  Surgeon: Rogene Houston, MD;  Location: [facility];  Service: Endoscopy;  Laterality: N/A;  730    Family History  Problem Relation Age of Onset  . Hypertension Mother   . CAD Paternal Grandfather    Social History:  reports that he has never smoked. He has never used smokeless tobacco. He reports that he does not drink alcohol or use drugs.  Allergies: No Known Allergies  Medications Prior to Admission   Medication Sig Dispense Refill  . APPLE CIDER VINEGAR PO Take 2 tablets by mouth daily. Gummies    . Ascorbic Acid (VITAMIN C GUMMIE PO) Take 1,500 mg by mouth daily.    . Cholecalciferol (VITAMIN D) 50 MCG (2000 UT) tablet Take 4,000 Units by mouth daily.    . fluticasone (CUTIVATE) 0.05 % cream Apply 1 application topically daily as needed (for itch/red face. Mixed with Metronidazole.).    Marland Kitchen metroNIDAZOLE (METROCREAM) 0.75 % cream Apply 1 application topically daily as needed (for itch/red face. Mixed with Fluticasone.).    Marland Kitchen montelukast (SINGULAIR) 10 MG tablet Take 1 tablet (10 mg total) by mouth at bedtime. 90 tablet 3  . Multiple Vitamins-Minerals (ADULT GUMMY PO) Take 2 tablets by mouth daily.    Ernestine Conrad Fatty Acids-Vitamins (OMEGA-3 GUMMIES) CHEW Chew 2 each by mouth daily.     Marland Kitchen omeprazole (PRILOSEC) 20 MG capsule Take 20 mg by mouth daily as needed (acid reflux/indigestion.).       Results for orders placed or performed during the hospital encounter of 02/28/19 (from the past 48 hour(s))  SARS CORONAVIRUS 2 (TAT 6-24 HRS) Nasopharyngeal Nasopharyngeal Swab     Status: None   Collection Time: 02/28/19 10:26 AM   Specimen: Nasopharyngeal Swab  Result Value Ref Range   SARS Coronavirus 2 NEGATIVE NEGATIVE    Comment: (NOTE) SARS-CoV-2 target nucleic acids are NOT DETECTED. The SARS-CoV-2 RNA is generally detectable in upper and lower respiratory specimens during the acute phase of infection. Negative results do not preclude SARS-CoV-2 infection, do not rule out co-infections with  other pathogens, and should not be used as the sole basis for treatment or other patient management decisions. Negative results must be combined with clinical observations, patient history, and epidemiological information. The expected result is Negative. Fact Sheet for Patients: HairSlick.no Fact Sheet for Healthcare  Providers: quierodirigir.com This test is not yet approved or cleared by the [place] FDA and  has been authorized for detection and/or diagnosis of SARS-CoV-2 by FDA under an Emergency Use Authorization (EUA). This EUA will remain  in effect (meaning this test can be used) for the duration of the COVID-19 declaration under Section 56 4(b)(1) of the Act, 21 U.S.C. section 360bbb-3(b)(1), unless the authorization is terminated or revoked sooner. Performed at [facility], [address], [place], [place] [address]    No results found.  Review of Systems  Blood pressure 131/90, temperature 97.7 F (36.5 C), temperature source Oral, resp. rate 20, height 5\' 10"  (1.778 m), weight 88.5 kg, SpO2 98 %. Physical Exam  Constitutional: He appears well-developed and well-nourished.  HENT:  Mouth/Throat: Oropharynx is clear and moist.  Eyes: Conjunctivae are normal. No scleral icterus.  Neck: No thyromegaly present.  Cardiovascular: Normal rate and normal heart sounds.  No murmur heard. Respiratory: Effort normal and breath sounds normal.  GI: Soft. He exhibits no distension and no mass. There is no abdominal tenderness.  Musculoskeletal:        General: No edema.  Lymphadenopathy:    He has no cervical adenopathy.  Neurological: He is alert.  Skin: Skin is warm.     Assessment/Plan Esophageal dysphagia in a patient with history of eosinophilic esophagitis and GERD. Esophagogastroduodenoscopy with esophageal dilation and biopsy and average rescreening colonoscopy.  , MD 03/02/2019, 8:19 AM

## 2019-03-03 LAB — SURGICAL PATHOLOGY

## 2019-03-05 ENCOUNTER — Other Ambulatory Visit (INDEPENDENT_AMBULATORY_CARE_PROVIDER_SITE_OTHER): Payer: Self-pay | Admitting: Internal Medicine

## 2019-03-05 DIAGNOSIS — K200 Eosinophilic esophagitis: Secondary | ICD-10-CM

## 2019-03-05 MED ORDER — FLUTICASONE PROPIONATE HFA 220 MCG/ACT IN AERO: 4.0000 | INHALATION_SPRAY | Freq: Two times a day (BID) | RESPIRATORY_TRACT | 0 refills | Status: DC

## 2019-03-31 ENCOUNTER — Telehealth (INDEPENDENT_AMBULATORY_CARE_PROVIDER_SITE_OTHER): Payer: Self-pay | Admitting: Internal Medicine

## 2019-03-31 NOTE — Telephone Encounter (Signed)
Patient left message stating he is waiting to hear from nurse Tammy - ph# [phone]

## 2019-04-03 ENCOUNTER — Other Ambulatory Visit (INDEPENDENT_AMBULATORY_CARE_PROVIDER_SITE_OTHER): Payer: Self-pay | Admitting: Internal Medicine

## 2019-04-07 NOTE — Telephone Encounter (Signed)
I have called the patient, and left on his voicemail that the lab order had been released and he may go to [facility] to get it drawn. If he had any other questions to please call our office back.

## 2019-10-11 IMAGING — RF DG ESOPHAGUS
10 of 14 series · 14 of 24 positions shown · non-contrast
Comparison: 07/08/2004.

CLINICAL DATA: Dysphagia.  Prior soft geode dilatation.

EXAM:
ESOPHOGRAM / BARIUM SWALLOW / BARIUM TABLET STUDY
TECHNIQUE: Combined double contrast and single contrast examination performed
using effervescent crystals, thick barium liquid, and thin barium
liquid. The patient was observed with fluoroscopy swallowing a 13 mm
barium sulphate tablet.
FLUOROSCOPY TIME:  Fluoroscopy Time:  1 minutes 54 seconds
Radiation Exposure Index (if provided by the fluoroscopic device):
55.3 mGy
Number of Acquired Spot Images: 34

[Series 1: fluoro_barium 2fps_bw · 0.19mm/px · 2 of 7 frames shown (1 of 10)]
[frame 2/7]
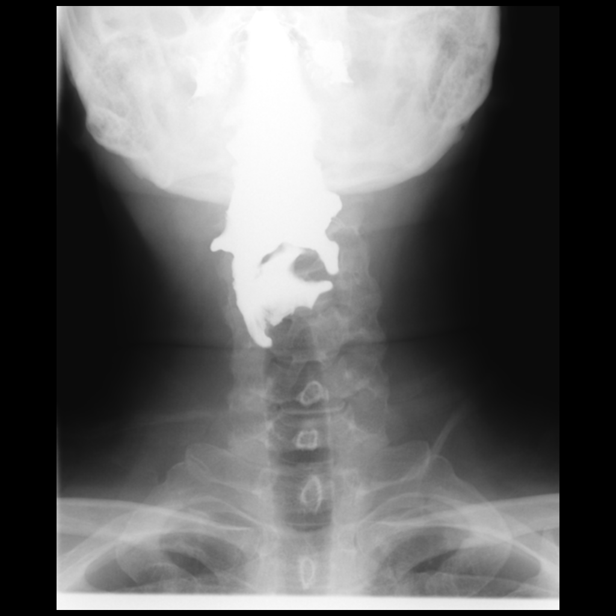
[frame 4/7]
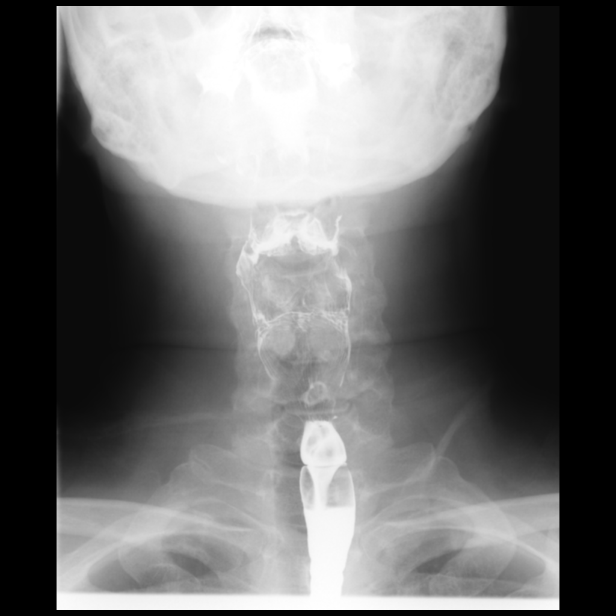

[Series 2: fluoro_barium 2fps_bw · 0.18mm/px · 3 of 7 frames shown (2 of 10)]
[frame 2/7]
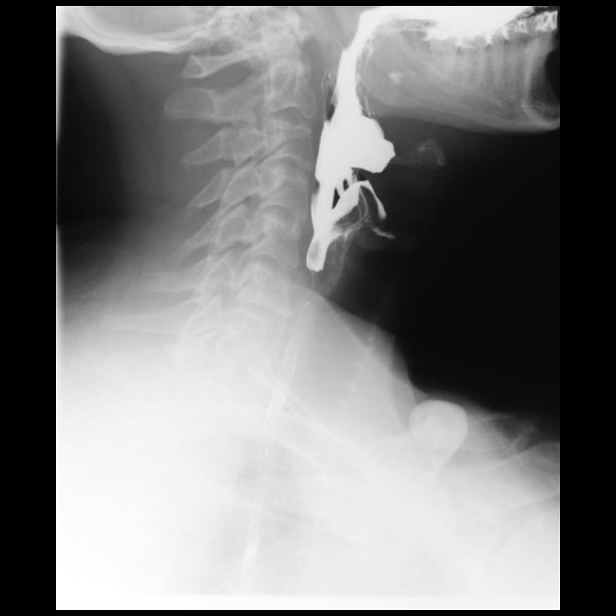
[frame 5/7]
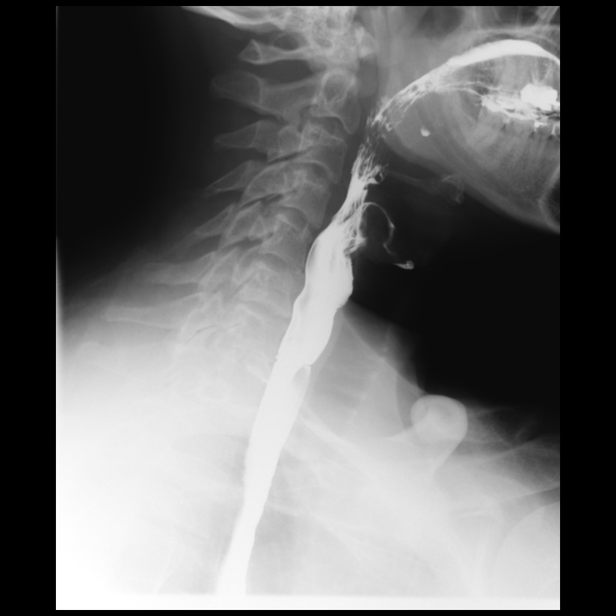
[frame 6/7]
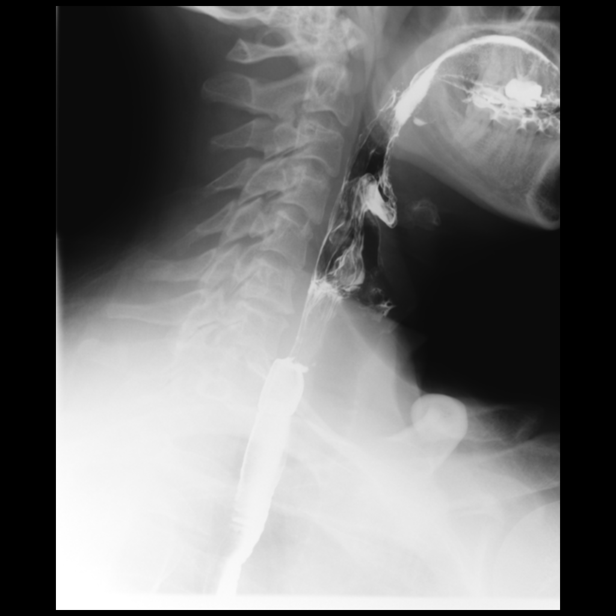

[Series 4: fluoro_barium 2fps_bw · 0.18mm/px · 2 of 5 frames shown (3 of 10)]
[frame 1/5]
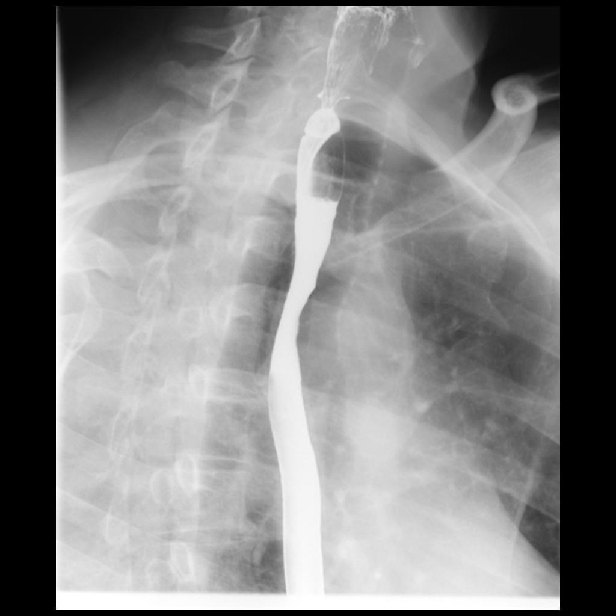
[frame 5/5]
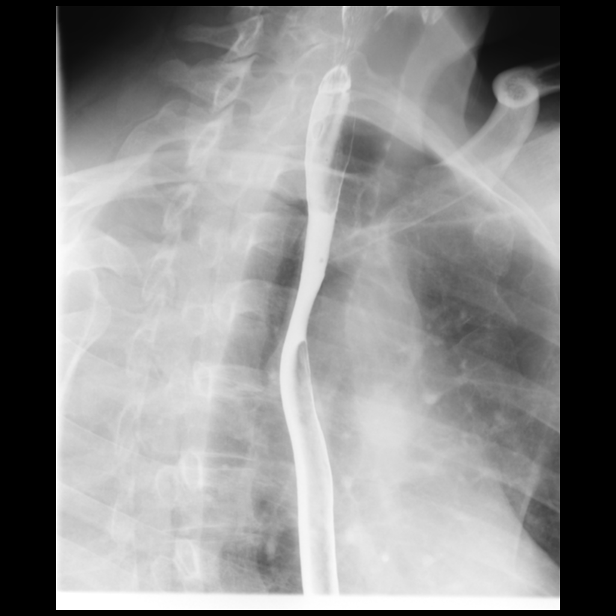

[Series 6: fluoro_barium 2fps_bw · 0.18mm/px · 1 of 1 slices shown (4 of 10)]
[im 1/1]
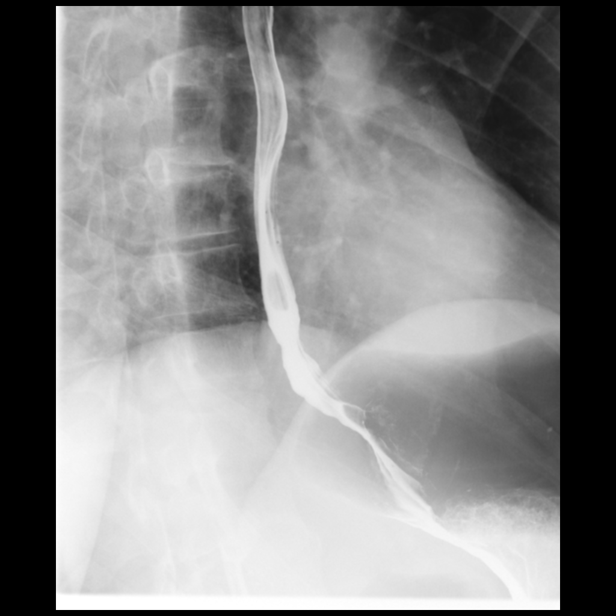

[Series 7: fluoro_barium 2fps_bw · 0.19mm/px · 1 of 4 frames shown (5 of 10)]
[frame 3/4]
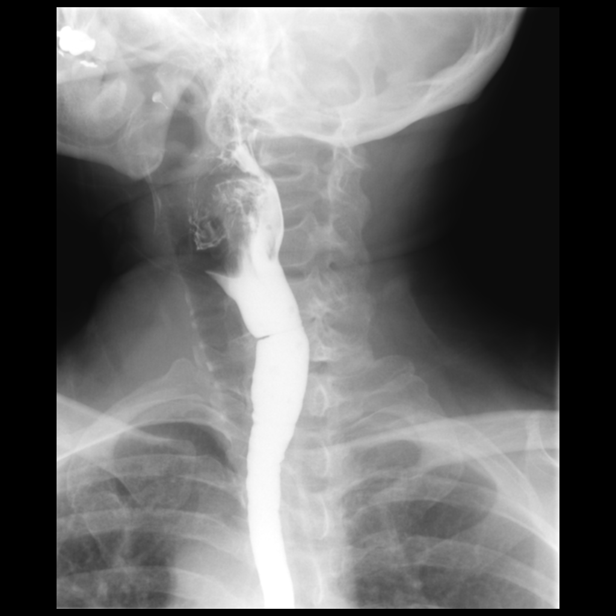

[Series 8: fluoro_barium 2fps_bw · 0.19mm/px · 1 of 1 slices shown (6 of 10)]
[im 1/1]
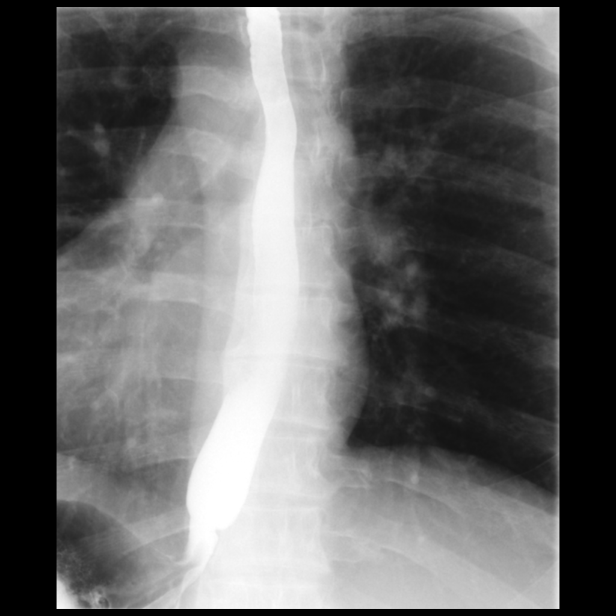

[Series 10: fluoro_barium 2fps_bw · 0.19mm/px · 1 of 1 slices shown (7 of 10)]
[im 1/1]
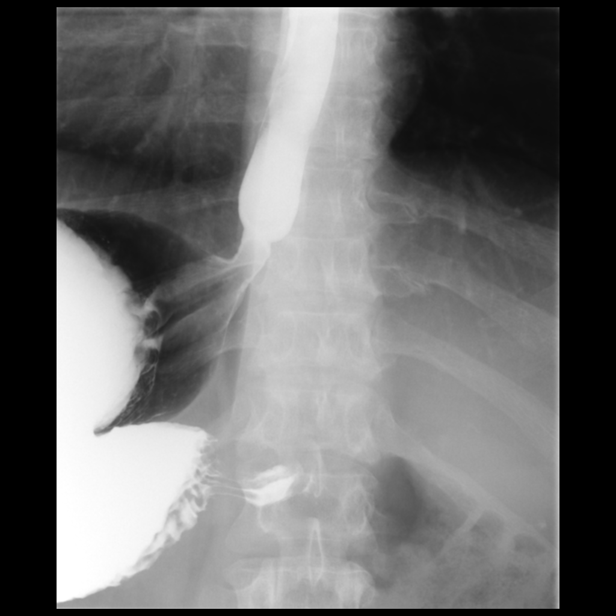

[Series 11: fluoro_barium 2fps_bw · 0.19mm/px · 1 of 1 slices shown (8 of 10)]
[im 1/1]
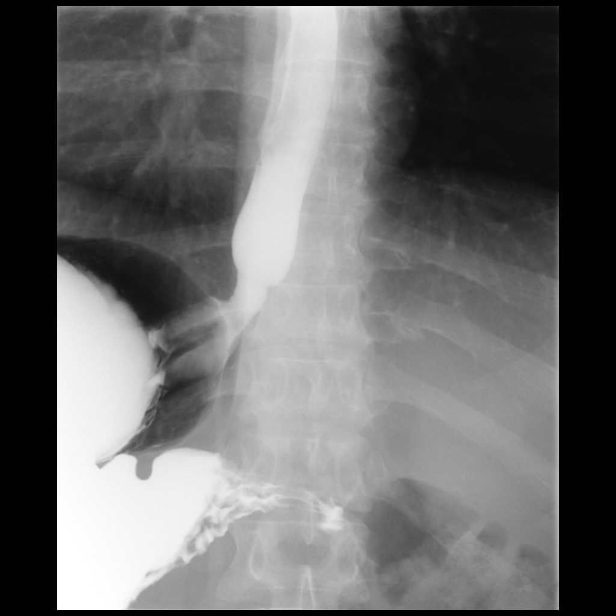

[Series 13: fluoro_barium 2fps_bw · 0.19mm/px · 1 of 1 slices shown (9 of 10)]
[im 1/1]
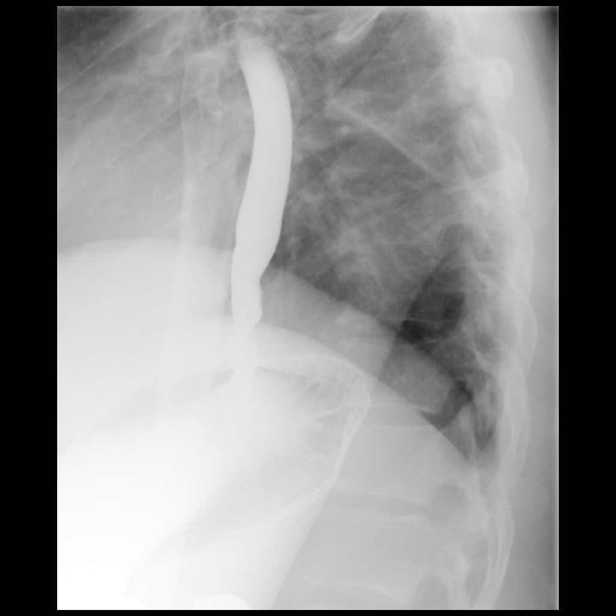

[Series 15: fluoro_barium 2fps_bw · 0.18mm/px · 1 of 1 slices shown (10 of 10)]
[im 1/1]
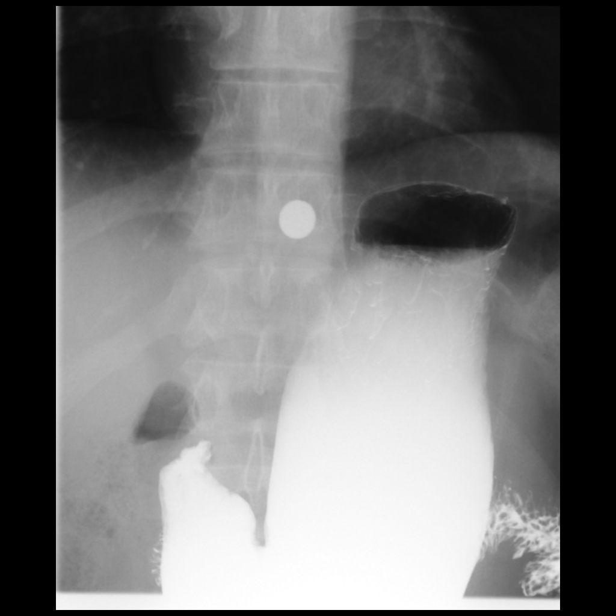

[14 of 24 positions shown; findings below may reference images not displayed]

FINDINGS: Lower cervical esophagus again demonstrates a web without interim
change. Distal esophageal approximately 3 cm stricture is noted just
above the gastroesophageal junction. This could be benign or
malignant. This stricture resulted in delayed passage of barium
tablet. Moderate gastroesophageal reflux noted.
IMPRESSION: 1.  Unchanged lower esophageal web.

2. Distal esophageal approximately 3 cm long stricture just above
the gastroesophageal junction. This could be benign or malignant.
This stricture results in delay of passage of a barium tablet.
Moderate gastroesophageal reflux noted.

## 2020-04-13 ENCOUNTER — Other Ambulatory Visit (INDEPENDENT_AMBULATORY_CARE_PROVIDER_SITE_OTHER): Payer: Self-pay | Admitting: Internal Medicine

## 2020-04-16 NOTE — Telephone Encounter (Signed)
Will discuss treatment of eosinophilic esophagitis in next appointment, will not refill until appointment.

## 2020-04-16 NOTE — Telephone Encounter (Signed)
Has an follow up appointment with Dr. Marchia Bond 04/23/2020.

## 2020-04-23 ENCOUNTER — Ambulatory Visit (INDEPENDENT_AMBULATORY_CARE_PROVIDER_SITE_OTHER): Payer: 59 | Admitting: Gastroenterology

## 2020-04-25 ENCOUNTER — Encounter (INDEPENDENT_AMBULATORY_CARE_PROVIDER_SITE_OTHER): Payer: Self-pay | Admitting: Gastroenterology

## 2020-04-25 ENCOUNTER — Ambulatory Visit (INDEPENDENT_AMBULATORY_CARE_PROVIDER_SITE_OTHER): Payer: 59 | Admitting: Gastroenterology

## 2020-04-26 ENCOUNTER — Encounter (INDEPENDENT_AMBULATORY_CARE_PROVIDER_SITE_OTHER): Payer: Self-pay | Admitting: Gastroenterology

## 2020-04-26 ENCOUNTER — Other Ambulatory Visit: Payer: Self-pay

## 2020-04-26 ENCOUNTER — Ambulatory Visit (INDEPENDENT_AMBULATORY_CARE_PROVIDER_SITE_OTHER): Payer: 59 | Admitting: Gastroenterology

## 2020-04-26 VITALS — BP 147/94 | HR 74 | Temp 98.1°F | Ht 70.0 in | Wt 200.0 lb

## 2020-04-26 DIAGNOSIS — K200 Eosinophilic esophagitis: Secondary | ICD-10-CM

## 2020-04-26 DIAGNOSIS — K219 Gastro-esophageal reflux disease without esophagitis: Secondary | ICD-10-CM

## 2020-04-26 DIAGNOSIS — R1319 Other dysphagia: Secondary | ICD-10-CM

## 2020-04-26 MED ORDER — OMEPRAZOLE 40 MG PO CPDR: 40.0000 mg | DELAYED_RELEASE_CAPSULE | Freq: Every day | ORAL | 1 refills | Status: DC

## 2020-04-26 NOTE — Progress Notes (Signed)
Katrinka Blazing, M.D. Gastroenterology & Hepatology [facility] [address] [place], [place] [address]  Primary Care Physician: Elfredia Nevins, MD [address] [place] [address]  I will communicate my assessment and recommendations to the referring MD via EMR.  Problems: 1. EoE 2. GERD  History of Present Illness: Matthew Carr is a 50 y.o. male with past medical history of eosinophilic esophagitis and GERD, who presents for follow up of eosinophilic esophagitis and GERD.  The patient was last seen on 01/11/2019. At that time, the patient was ordered to have an EGD for evaluation of his disease.  He was refilled on Singulair.  Patient underwent an EGD on 03/02/2019 which showed however in the proximal esophagus, as well as renal esophagus and longitudinal furrows in the entire esophagus.  Biopsies were taken from both sites which showed more than 50 eosinophils per high-power field.  There was presence of a stenosis between 39 to 40 cm from the incisors which was dilated with the passage of the scope.  Stomach and small bowel were within normal limits.  After this, the patient was prescribed fluticasone, which he took fluticasone for 6 weeks and he reported some improvement of his symptomatology since then.  He is not currently taking any other medication besides the Singulair.  Regarding his GERD, patient has taken Prilosec 20 mg as needed for heartburn. Has never tried taking this medication daily. He has only taken the medication once every two weeks.  The patient currently reports having some episodes of dysphagia to solids mostly which is less frequent than in the past.  He reports that occasionally food that is "dry" takes longer to go down but he denies having to drink any liquids for the attempt to eventually move to his stomach.  Denies any odynophagia.  The patient denies having any nausea, vomiting, fever, chills,  hematochezia, melena, hematemesis, abdominal distention, abdominal pain, diarrhea, jaundice, pruritus or weight loss.  Last Colonoscopy:2/3/ 2021, normal  FHx: neg for any gastrointestinal/liver disease, mother breast cancer Social: neg smoking, alcohol or illicit drug use  Past Medical History: Past Medical History:  Diagnosis Date  . GERD (gastroesophageal reflux disease)   . Glaucoma   . PONV (postoperative nausea and vomiting)     Past Surgical History: Past Surgical History:  Procedure Laterality Date  . BALLOON DILATION N/A 03/02/2019   Procedure: BALLOON DILATION;  Surgeon: Malissa Hippo, MD;  [facility];  Service: Endoscopy;  Laterality: N/A;  . BIOPSY  03/02/2019   Procedure: BIOPSY;  Surgeon: Malissa Hippo, MD;  [facility];  Service: Endoscopy;;  esophagus  . COLONOSCOPY WITH PROPOFOL N/A 03/02/2019   Procedure: COLONOSCOPY WITH PROPOFOL;  Surgeon: Malissa Hippo, MD;  [facility];  Service: Endoscopy;  Laterality: N/A;  . ESOPHAGEAL DILATION N/A 04/10/2017   Procedure: ESOPHAGEAL DILATION;  Surgeon: Malissa Hippo, MD;  [facility];  Service: Endoscopy;  Laterality: N/A;  . ESOPHAGEAL DILATION N/A 07/02/2017   Procedure: ESOPHAGEAL DILATION;  Surgeon: Malissa Hippo, MD;  [facility];  Service: Endoscopy;  Laterality: N/A;  . ESOPHAGOGASTRODUODENOSCOPY N/A 04/10/2017   Procedure: ESOPHAGOGASTRODUODENOSCOPY (EGD);  Surgeon: Malissa Hippo, MD;  [facility];  Service: Endoscopy;  Laterality: N/A;  . ESOPHAGOGASTRODUODENOSCOPY N/A 07/02/2017   Procedure: ESOPHAGOGASTRODUODENOSCOPY (EGD);  Surgeon: Malissa Hippo, MD;  [facility];  Service: Endoscopy;  Laterality: N/A;  730  .  ESOPHAGOGASTRODUODENOSCOPY (EGD) WITH PROPOFOL N/A 03/02/2019   Procedure: ESOPHAGOGASTRODUODENOSCOPY (EGD) WITH PROPOFOL;  Surgeon: Malissa Hippo, MD;  [facility];  Service: Endoscopy;  Laterality:  N/A;  8:55    Family History: Family History  Problem Relation Age of Onset  . Hypertension Mother   . CAD Paternal Grandfather     Social History: Social History   Tobacco Use  Smoking Status Never Smoker  Smokeless Tobacco Never Used   Social History   Substance and Sexual Activity  Alcohol Use Yes   Comment: wine occasionally   Social History   Substance and Sexual Activity  Drug Use No    Allergies: No Known Allergies  Medications: Current Outpatient Medications  Medication Sig Dispense Refill  . APPLE CIDER VINEGAR PO Take 2 tablets by mouth daily. Gummies    . Ascorbic Acid (VITAMIN C GUMMIE PO) Take 1,500 mg by mouth daily.    . Cholecalciferol (VITAMIN D) 50 MCG (2000 UT) tablet Take 4,000 Units by mouth daily.    . fluticasone (CUTIVATE) 0.05 % cream Apply 1 application topically daily as needed (for itch/red face. Mixed with Metronidazole.).    Marland Kitchen metroNIDAZOLE (METROCREAM) 0.75 % cream Apply 1 application topically daily as needed (for itch/red face. Mixed with Fluticasone.).    Marland Kitchen montelukast (SINGULAIR) 10 MG tablet TAKE 1 TABLET BY MOUTH AT BEDTIME 90 tablet 0  . Multiple Vitamins-Minerals (ADULT GUMMY PO) Take 2 tablets by mouth daily.    Ailene Ards Fatty Acids-Vitamins (OMEGA-3 GUMMIES) CHEW Chew 2 each by mouth daily.     Marland Kitchen omeprazole (PRILOSEC) 20 MG capsule Take 20 mg by mouth daily as needed (acid reflux/indigestion.).      No current facility-administered medications for this visit.    Review of Systems: GENERAL: negative for malaise, night sweats HEENT: No changes in hearing or vision, no nose bleeds or other nasal problems. NECK: Negative for lumps, goiter, pain and significant neck swelling RESPIRATORY: Negative for cough, wheezing CARDIOVASCULAR: Negative for chest pain, leg swelling, palpitations, orthopnea GI: SEE HPI MUSCULOSKELETAL: Negative for joint pain or swelling, back pain, and muscle pain. SKIN: Negative for lesions, rash PSYCH:  Negative for sleep disturbance, mood disorder and recent psychosocial stressors. HEMATOLOGY Negative for prolonged bleeding, bruising easily, and swollen nodes. ENDOCRINE: Negative for cold or heat intolerance, polyuria, polydipsia and goiter. NEURO: negative for tremor, gait imbalance, syncope and seizures. The remainder of the review of systems is noncontributory.   Physical Exam: BP (!) 147/94 (BP Location: Left Arm, Patient Position: Sitting, Cuff Size: Large)   Pulse 74   Temp 98.1 F (36.7 C) (Oral)   Ht 5\' 10"  (1.778 m)   Wt 200 lb (90.7 kg)   BMI 28.70 kg/m  GENERAL: The patient is AO x3, in no acute distress. HEENT: Head is normocephalic and atraumatic. EOMI are intact. Mouth is well hydrated and without lesions. NECK: Supple. No masses LUNGS: Clear to auscultation. No presence of rhonchi/wheezing/rales. Adequate chest expansion HEART: RRR, normal s1 and s2. ABDOMEN: Soft, nontender, no guarding, no peritoneal signs, and nondistended. BS +. No masses. EXTREMITIES: Without any cyanosis, clubbing, rash, lesions or edema. NEUROLOGIC: AOx3, no focal motor deficit. SKIN: no jaundice, no rashes  Imaging/Labs: as above  I personally reviewed and interpreted the available labs, imaging and endoscopic files.  Impression and Plan: Matthew Carr is a 50 y.o. male with past medical history of eosinophilic esophagitis and GERD, who presents for follow up of eosinophilic esophagitis and GERD.  Has  presented relative good control of his GERD symptoms.  However, I believe that his eosinophilic esophagitis requires a tighter control of the inflammatory changes as he had evidence of active inflammation and stricture during his most recent endoscopy.  He is currently taking Singulair, which I do not consider is controlling the inflammation and has little to no role in the management of eosinophilic esophagitis.  I explained to the patient that at this point will consider a PPI trial at the  easiest and most cost beneficial treatment as he also has GERD.  I also explained to him for close to 35 minutes the possibility of implementing a food elimination diet or use of topical steroids for management of his bloating but he decided to pursue the use of PPI, will prescribe Prilosec 40 mg twice a day.  It would be important that if he has presented improvement of his symptoms, we will need to repeat an EGD in 3 months.  Patient understood and agreed.  - Increase omeprazole to 40 mg BID - Stop Singulair - Will consider EGD in 3 months based on symptom control  All questions were answered.      Dolores Frame, MD Gastroenterology and Hepatology [facility]

## 2020-04-26 NOTE — Patient Instructions (Signed)
-  Increase omeprazole to 40mg BID 

## 2020-04-30 ENCOUNTER — Telehealth (INDEPENDENT_AMBULATORY_CARE_PROVIDER_SITE_OTHER): Payer: Self-pay | Admitting: Gastroenterology

## 2020-04-30 ENCOUNTER — Other Ambulatory Visit (INDEPENDENT_AMBULATORY_CARE_PROVIDER_SITE_OTHER): Payer: Self-pay

## 2020-04-30 DIAGNOSIS — R1319 Other dysphagia: Secondary | ICD-10-CM

## 2020-04-30 DIAGNOSIS — K200 Eosinophilic esophagitis: Secondary | ICD-10-CM

## 2020-04-30 DIAGNOSIS — K219 Gastro-esophageal reflux disease without esophagitis: Secondary | ICD-10-CM

## 2020-04-30 MED ORDER — OMEPRAZOLE 40 MG PO CPDR: 40.0000 mg | DELAYED_RELEASE_CAPSULE | Freq: Two times a day (BID) | ORAL | 1 refills | Status: DC

## 2020-04-30 NOTE — Telephone Encounter (Signed)
I called and left vm's on both numbers that he is correct the medication has been increased to Bid and I have sent this in to [facility].

## 2020-04-30 NOTE — Telephone Encounter (Signed)
Patient called stated he got his omeprazole he thought he was to take 2 a day - bottle says 1 per day - please advise - ph# [phone] or [phone]

## 2020-08-09 ENCOUNTER — Ambulatory Visit (INDEPENDENT_AMBULATORY_CARE_PROVIDER_SITE_OTHER): Payer: 59 | Admitting: Gastroenterology

## 2021-03-07 ENCOUNTER — Other Ambulatory Visit (INDEPENDENT_AMBULATORY_CARE_PROVIDER_SITE_OTHER): Payer: Self-pay | Admitting: Gastroenterology

## 2021-03-07 DIAGNOSIS — K200 Eosinophilic esophagitis: Secondary | ICD-10-CM

## 2021-03-07 DIAGNOSIS — R1319 Other dysphagia: Secondary | ICD-10-CM

## 2021-03-07 DIAGNOSIS — K219 Gastro-esophageal reflux disease without esophagitis: Secondary | ICD-10-CM

## 2021-03-08 ENCOUNTER — Encounter (INDEPENDENT_AMBULATORY_CARE_PROVIDER_SITE_OTHER): Payer: Self-pay

## 2021-03-11 ENCOUNTER — Other Ambulatory Visit (INDEPENDENT_AMBULATORY_CARE_PROVIDER_SITE_OTHER): Payer: Self-pay | Admitting: Gastroenterology

## 2021-03-11 DIAGNOSIS — R1319 Other dysphagia: Secondary | ICD-10-CM

## 2021-03-11 DIAGNOSIS — K200 Eosinophilic esophagitis: Secondary | ICD-10-CM

## 2021-03-11 DIAGNOSIS — K219 Gastro-esophageal reflux disease without esophagitis: Secondary | ICD-10-CM

## 2023-03-20 ENCOUNTER — Other Ambulatory Visit (HOSPITAL_COMMUNITY): Payer: Self-pay | Admitting: Internal Medicine

## 2023-03-20 ENCOUNTER — Ambulatory Visit (HOSPITAL_COMMUNITY)
Admission: RE | Admit: 2023-03-20 | Discharge: 2023-03-20 | Disposition: A | Discharge status: Home / Self Care | Payer: 59 | Source: Ambulatory Visit | Attending: Internal Medicine | Admitting: Internal Medicine

## 2023-03-20 DIAGNOSIS — R52 Pain, unspecified: Secondary | ICD-10-CM | POA: Insufficient documentation

## 2023-06-01 ENCOUNTER — Other Ambulatory Visit: Payer: Self-pay | Admitting: Nurse Practitioner

## 2023-06-01 DIAGNOSIS — S46211A Strain of muscle, fascia and tendon of other parts of biceps, right arm, initial encounter: Secondary | ICD-10-CM

## 2023-06-01 DIAGNOSIS — S46212A Strain of muscle, fascia and tendon of other parts of biceps, left arm, initial encounter: Secondary | ICD-10-CM

## 2023-06-03 ENCOUNTER — Other Ambulatory Visit: Payer: Self-pay | Admitting: Family Medicine

## 2023-06-03 DIAGNOSIS — S46212A Strain of muscle, fascia and tendon of other parts of biceps, left arm, initial encounter: Secondary | ICD-10-CM

## 2023-06-09 ENCOUNTER — Ambulatory Visit
Admission: RE | Admit: 2023-06-09 | Discharge: 2023-06-09 | Disposition: A | Discharge status: Home / Self Care | Payer: Worker's Compensation | Source: Ambulatory Visit | Attending: Nurse Practitioner | Admitting: Nurse Practitioner

## 2023-06-09 DIAGNOSIS — S46212A Strain of muscle, fascia and tendon of other parts of biceps, left arm, initial encounter: Secondary | ICD-10-CM

## 2023-06-19 ENCOUNTER — Encounter (HOSPITAL_BASED_OUTPATIENT_CLINIC_OR_DEPARTMENT_OTHER): Payer: Self-pay | Admitting: Orthopaedic Surgery

## 2023-06-19 ENCOUNTER — Other Ambulatory Visit: Payer: Self-pay

## 2023-06-24 NOTE — H&P (Signed)
 PREOPERATIVE H&P  Chief Complaint: biceps tendon rupture left  HPI: Matthew Carr is a 53 y.o. male who is scheduled for, Procedure(s): REPAIR, TENDON, BICEPS, DISTAL.   Patient has a past medical history significant for GERD and PONV.   Patient had an injury to the left elbow on 4/20. He was reaching out and felt a pop. He had immediate pain. MRI confirmed distal biceps tear.   Symptoms are rated as moderate to severe, and have been worsening.  This is significantly impairing activities of daily living.    Please see clinic note for further details on this patient's care.    He has elected for surgical management.   Past Medical History:  Diagnosis Date   GERD (gastroesophageal reflux disease)    PONV (postoperative nausea and vomiting)    Past Surgical History:  Procedure Laterality Date   BALLOON DILATION N/A 03/02/2019   Procedure: BALLOON DILATION;  Surgeon: Ruby Corporal, MD;  [facility];  Service: Endoscopy;  Laterality: N/A;   BIOPSY  03/02/2019   Procedure: BIOPSY;  Surgeon: Ruby Corporal, MD;  [facility];  Service: Endoscopy;;  esophagus   COLONOSCOPY WITH PROPOFOL  N/A 03/02/2019   Procedure: COLONOSCOPY WITH PROPOFOL ;  Surgeon: Ruby Corporal, MD;  [facility];  Service: Endoscopy;  Laterality: N/A;   ESOPHAGEAL DILATION N/A 04/10/2017   Procedure: ESOPHAGEAL DILATION;  Surgeon: Ruby Corporal, MD;  [facility];  Service: Endoscopy;  Laterality: N/A;   ESOPHAGEAL DILATION N/A 07/02/2017   Procedure: ESOPHAGEAL DILATION;  Surgeon: Ruby Corporal, MD;  [facility];  Service: Endoscopy;  Laterality: N/A;   ESOPHAGOGASTRODUODENOSCOPY N/A 04/10/2017   Procedure: ESOPHAGOGASTRODUODENOSCOPY (EGD);  Surgeon: Ruby Corporal, MD;  [facility];  Service: Endoscopy;  Laterality: N/A;   ESOPHAGOGASTRODUODENOSCOPY N/A 07/02/2017   Procedure: ESOPHAGOGASTRODUODENOSCOPY (EGD);  Surgeon: Ruby Corporal, MD;  [facility];  Service: Endoscopy;  Laterality: N/A;  730   ESOPHAGOGASTRODUODENOSCOPY (EGD) WITH PROPOFOL  N/A 03/02/2019   Procedure: ESOPHAGOGASTRODUODENOSCOPY (EGD) WITH PROPOFOL ;  Surgeon: Ruby Corporal, MD;  [facility];  Service: Endoscopy;  Laterality: N/A;  8:55   Social History   Socioeconomic History   Marital status: Married    Spouse name: Not on file   Number of children: Not on file   Years of education: Not on file   Highest education level: Not on file  Occupational History   Not on file  Tobacco Use   Smoking status: Never   Smokeless tobacco: Never  Vaping Use   Vaping status: Never Used  Substance and Sexual Activity   Alcohol use: Yes    Comment: wine occasionally   Drug use: No   Sexual activity: Yes  Other Topics Concern   Not on file  Social History Narrative   Not on file   Social Drivers of Health   Financial Resource Strain: Not on file  Food Insecurity: Not on file  Transportation Needs: Not on file  Physical Activity: Not on file  Stress: Not on file  Social Connections: Not on file   Family History  Problem Relation Age of Onset   Hypertension Mother    CAD Paternal Grandfather    No Known Allergies Prior to Admission medications   Medication Sig Start Date End Date Taking? Authorizing Provider  APPLE CIDER VINEGAR PO Take 2 tablets by mouth daily. Gummies   Yes [provider]  Ascorbic Acid (VITAMIN C GUMMIE PO) Take 1,500 mg by mouth daily.   Yes [provider]  Cholecalciferol (VITAMIN D) 50 MCG (2000 UT) tablet Take 4,000 Units by mouth daily.   Yes [provider]  Multiple Vitamins-Minerals (ADULT GUMMY PO) Take 2 tablets by mouth daily.   Yes [provider]  Omega Fatty Acids-Vitamins (OMEGA-3 GUMMIES) CHEW Chew 2 each by mouth daily.    Yes [provider]  omeprazole  (PRILOSEC) 40 MG capsule Take 1 capsule (40 mg total) by mouth 2 (two) times daily.  03/08/21  Yes Urban Garden, MD  fluticasone  (CUTIVATE ) 0.05 % cream Apply 1 application topically daily as needed (for itch/red face. Mixed with Metronidazole.).    [provider]  metroNIDAZOLE (METROCREAM) 0.75 % cream Apply 1 application topically daily as needed (for itch/red face. Mixed with Fluticasone .).    [provider]    ROS: All other systems have been reviewed and were otherwise negative with the exception of those mentioned in the HPI and as above.  Physical Exam: General: Alert, no acute distress Cardiovascular: No pedal edema Respiratory: No cyanosis, no use of accessory musculature GI: No organomegaly, abdomen is soft and non-tender Skin: No lesions in the area of chief complaint Neurologic: Sensation intact distally Psychiatric: Patient is competent for consent with normal mood and affect Lymphatic: No axillary or cervical lymphadenopathy  MUSCULOSKELETAL:  Left elbow: tender to palpation distal biceps. Obvious deformity. ROM from 40-100 degrees. Distal motor and sensory function intact. Warm well perfused hand.   Imaging: MRI of the left elbow demonstrates a 6 cm retracted distal biceps tear.   Assessment: biceps tendon rupture left  Plan: Plan for Procedure(s): REPAIR, TENDON, BICEPS, DISTAL  The risks benefits and alternatives were discussed with the patient including but not limited to the risks of nonoperative treatment, versus surgical intervention including infection, bleeding, nerve injury,  blood clots, cardiopulmonary complications, morbidity, mortality, among others, and they were willing to proceed.   The patient acknowledged the explanation, agreed to proceed with the plan and consent was signed.   Operative Plan: Left distal biceps tendon repair Discharge Medications: standard DVT Prophylaxis: none Physical Therapy: outpatient Special Discharge needs: Splint. Sling   Adine Ahmadi, PA-C  06/24/2023 1:22  PM

## 2023-06-24 NOTE — Discharge Instructions (Signed)
Ophelia Charter MD, MPH Noemi Chapel, PA-C [place] [address] [phone] (tel)   [phone] (fax)   POST-OPERATIVE INSTRUCTIONS   WOUND CARE Please keep splint clean dry and intact until followup.  You may shower on Post-Op Day #3.  You must keep splint dry during this process and may find that a plastic bag taped around the extremity or alternatively a towel based bath may be a better option.   If you get your splint wet or if it is damaged please contact our clinic.  EXERCISES Due to your splint being in place you will not be able to bear weight through your extremity.    You may use a sling for comfort   It is normal for your fingers/hand to become more swollen after surgery and discolored from bruising.   This will resolve over the first few weeks usually after surgery. Please continue to ambulate and do not stay sitting or lying for too long.  Perform foot and wrist pumps to assist in circulation.   REGIONAL ANESTHESIA (NERVE BLOCKS) The anesthesia team may have performed a nerve block for you this is a great tool used to minimize pain.   The block may start wearing off overnight (between 8-24 hours postop) When the block wears off, your pain may go from nearly zero to the pain you would have had postop without the block. This is an abrupt transition but nothing dangerous is happening.   This can be a challenging period but utilize your as needed pain medications to try and manage this period. We suggest you use the pain medication the first night prior to going to bed, to ease this transition.  You may take an extra dose of narcotic when this happens if needed   POST-OP MEDICATIONS- Multimodal approach to pain control In general your pain will be controlled with a combination of substances.  Prescriptions unless otherwise discussed are electronically sent to your pharmacy.  This is a carefully made plan we use to minimize  narcotic use.     Meloxicam - Anti-inflammatory medication taken on a scheduled basis Acetaminophen - Non-narcotic pain medicine taken on a scheduled basis  Oxycodone - This is a strong narcotic, to be used only on an "as needed" basis for SEVERE pain. Zofran - take as needed for nausea   FOLLOW-UP If you develop a Fever (>101.5), Redness or Drainage from the surgical incision site, please call our office to arrange for an evaluation. Please call the office to schedule a follow-up appointment for your incision check if you do not already have one, 7-10 days post-operatively.   HELPFUL INFORMATION    You may be more comfortable sleeping in a semi-seated position the first few nights following surgery.  Keep a pillow propped under the elbow and forearm for comfort.  If you have a recliner type of chair it might be beneficial.  If not that is fine too, but it would be helpful to sleep propped up with pillows behind your operated shoulder as well under your elbow and forearm.  This will reduce pulling on the suture lines.   When dressing, put your operative arm in the sleeve first.  When getting undressed, take your operative arm out last.  Loose fitting, button-down shirts are recommended.  Often in the first days after surgery you may be more comfortable keeping your operative arm under your shirt and not through the sleeve.   You may return to work/school in the  next couple of days when you feel up to it.  Desk work and typing in the sling is fine.   We suggest you use the pain medication the first night prior to going to bed, in order to ease any pain when the anesthesia wears off. You should avoid taking pain medications on an empty stomach as it will make you nauseous.   You should wean off your narcotic medicines as soon as you are able.  Most patients will be off narcotics before their first postop appointment.    Do not drink alcoholic beverages or take illicit drugs when taking pain  medications.   It is against the law to drive while taking narcotics.  In some states it is against the law to drive while your arm is in a sling.    Pain medication may make you constipated.  Below are a few solutions to try in this order:   - Decrease the amount of pain medication if you aren't having pain.   - Drink lots of decaffeinated fluids.   - Drink prune juice and/or each dried prunes   If the first 3 don't work start with additional solutions   - Take Colace - an over-the-counter stool softener   - Take Senokot - an over-the-counter laxative   - Take Miralax - a stronger over-the-counter laxative   For more information including helpful videos and documents visit our website:   [email or web address]

## 2023-06-24 NOTE — Anesthesia Preprocedure Evaluation (Signed)
 Anesthesia Evaluation  Patient identified by MRN, date of birth, ID band Patient awake    Reviewed: Allergy & Precautions, NPO status , Patient's Chart, lab work & pertinent test results  History of Anesthesia Complications (+) PONV  Airway Mallampati: II  TM Distance: >3 FB Neck ROM: Full    Dental  (+) Dental Advisory Given   Pulmonary neg pulmonary ROS   breath sounds clear to auscultation       Cardiovascular negative cardio ROS  Rhythm:Regular Rate:Normal     Neuro/Psych negative neurological ROS     GI/Hepatic Neg liver ROS,GERD (esophageal stricture)  Medicated and Controlled,,  Endo/Other  negative endocrine ROS    Renal/GU negative Renal ROS     Musculoskeletal   Abdominal   Peds  Hematology negative hematology ROS (+)   Anesthesia Other Findings   Reproductive/Obstetrics                             Anesthesia Physical Anesthesia Plan  ASA: 2  Anesthesia Plan: General   Post-op Pain Management: Regional block* and Tylenol  PO (pre-op)*   Induction: Intravenous  PONV Risk Score and Plan: Ondansetron, Dexamethasone and Scopolamine  patch - Pre-op  Airway Management Planned: Oral ETT  Additional Equipment: None  Intra-op Plan:   Post-operative Plan: Extubation in OR  Informed Consent: I have reviewed the patients History and Physical, chart, labs and discussed the procedure including the risks, benefits and alternatives for the proposed anesthesia with the patient or authorized representative who has indicated his/her understanding and acceptance.     Dental advisory given  Plan Discussed with: CRNA and Surgeon  Anesthesia Plan Comments: (Plan routine monitors, GETA with supraclavicular block for post op analgesia)        Anesthesia Quick Evaluation

## 2023-06-25 ENCOUNTER — Encounter (HOSPITAL_BASED_OUTPATIENT_CLINIC_OR_DEPARTMENT_OTHER)
Admission: RE | Disposition: A | Discharge status: Home / Self Care | Payer: Self-pay | Source: Ambulatory Visit | Attending: Orthopaedic Surgery

## 2023-06-25 ENCOUNTER — Encounter (HOSPITAL_BASED_OUTPATIENT_CLINIC_OR_DEPARTMENT_OTHER): Payer: Self-pay | Admitting: Orthopaedic Surgery

## 2023-06-25 ENCOUNTER — Other Ambulatory Visit: Payer: Self-pay

## 2023-06-25 ENCOUNTER — Ambulatory Visit (HOSPITAL_BASED_OUTPATIENT_CLINIC_OR_DEPARTMENT_OTHER): Payer: Worker's Compensation | Admitting: Anesthesiology

## 2023-06-25 ENCOUNTER — Ambulatory Visit (HOSPITAL_BASED_OUTPATIENT_CLINIC_OR_DEPARTMENT_OTHER)
Admission: RE | Admit: 2023-06-25 | Discharge: 2023-06-25 | Disposition: A | Discharge status: Home / Self Care | Payer: Worker's Compensation | Source: Ambulatory Visit | Attending: Orthopaedic Surgery | Admitting: Orthopaedic Surgery

## 2023-06-25 ENCOUNTER — Ambulatory Visit (HOSPITAL_BASED_OUTPATIENT_CLINIC_OR_DEPARTMENT_OTHER): Payer: Self-pay | Admitting: Anesthesiology

## 2023-06-25 DIAGNOSIS — K219 Gastro-esophageal reflux disease without esophagitis: Secondary | ICD-10-CM | POA: Diagnosis not present

## 2023-06-25 DIAGNOSIS — Z79899 Other long term (current) drug therapy: Secondary | ICD-10-CM | POA: Insufficient documentation

## 2023-06-25 DIAGNOSIS — S46212A Strain of muscle, fascia and tendon of other parts of biceps, left arm, initial encounter: Secondary | ICD-10-CM

## 2023-06-25 DIAGNOSIS — Z01818 Encounter for other preprocedural examination: Secondary | ICD-10-CM

## 2023-06-25 DIAGNOSIS — M66322 Spontaneous rupture of flexor tendons, left upper arm: Secondary | ICD-10-CM | POA: Diagnosis present

## 2023-06-25 HISTORY — PX: DISTAL BICEPS TENDON REPAIR: SHX1461

## 2023-06-25 SURGERY — REPAIR, TENDON, BICEPS, DISTAL
Anesthesia: General | Site: Arm Lower | Laterality: Left

## 2023-06-25 MED ORDER — LACTATED RINGERS IV SOLN: INTRAVENOUS | Status: DC

## 2023-06-25 MED ORDER — ACETAMINOPHEN 10 MG/ML IV SOLN
1000.0000 mg | Freq: Once | INTRAVENOUS | Status: AC
  Administered 2023-06-25: 1000 mg via INTRAVENOUS

## 2023-06-25 MED ORDER — PROPOFOL 10 MG/ML IV BOLUS
INTRAVENOUS | Status: DC | PRN
  Administered 2023-06-25: 200 mg via INTRAVENOUS

## 2023-06-25 MED ORDER — MEPERIDINE HCL 25 MG/ML IJ SOLN: 6.2500 mg | INTRAMUSCULAR | Status: DC | PRN

## 2023-06-25 MED ORDER — FENTANYL CITRATE (PF) 100 MCG/2ML IJ SOLN
100.0000 ug | Freq: Once | INTRAMUSCULAR | Status: AC
  Administered 2023-06-25: 100 ug via INTRAVENOUS

## 2023-06-25 MED ORDER — ACETAMINOPHEN 500 MG PO TABS: 1000.0000 mg | ORAL_TABLET | Freq: Three times a day (TID) | ORAL | 0 refills | Status: AC

## 2023-06-25 MED ORDER — DEXAMETHASONE SODIUM PHOSPHATE 10 MG/ML IJ SOLN
INTRAMUSCULAR | Status: DC | PRN
  Administered 2023-06-25: 10 mg via INTRAVENOUS

## 2023-06-25 MED ORDER — CEFAZOLIN SODIUM-DEXTROSE 2-4 GM/100ML-% IV SOLN
INTRAVENOUS | Status: AC
  Filled 2023-06-25: qty 100

## 2023-06-25 MED ORDER — MIDAZOLAM HCL 2 MG/2ML IJ SOLN: 0.5000 mg | Freq: Once | INTRAMUSCULAR | Status: DC | PRN

## 2023-06-25 MED ORDER — VANCOMYCIN HCL 1000 MG IV SOLR
INTRAVENOUS | Status: DC | PRN
  Administered 2023-06-25: 1000 mg via TOPICAL

## 2023-06-25 MED ORDER — EPHEDRINE SULFATE (PRESSORS) 50 MG/ML IJ SOLN
INTRAMUSCULAR | Status: DC | PRN
  Administered 2023-06-25 (×2): 5 mg via INTRAVENOUS

## 2023-06-25 MED ORDER — MIDAZOLAM HCL 2 MG/2ML IJ SOLN
2.0000 mg | Freq: Once | INTRAMUSCULAR | Status: AC
  Administered 2023-06-25: 2 mg via INTRAVENOUS

## 2023-06-25 MED ORDER — ONDANSETRON HCL 4 MG/2ML IJ SOLN
INTRAMUSCULAR | Status: DC | PRN
  Administered 2023-06-25: 4 mg via INTRAVENOUS

## 2023-06-25 MED ORDER — GABAPENTIN 300 MG PO CAPS
ORAL_CAPSULE | ORAL | Status: AC
  Filled 2023-06-25: qty 1

## 2023-06-25 MED ORDER — OXYCODONE HCL 5 MG PO TABS: ORAL_TABLET | ORAL | 0 refills | Status: AC

## 2023-06-25 MED ORDER — EPHEDRINE 5 MG/ML INJ
INTRAVENOUS | Status: AC
Start: 2023-06-25 — End: ?
  Filled 2023-06-25: qty 5

## 2023-06-25 MED ORDER — MIDAZOLAM HCL 2 MG/2ML IJ SOLN
INTRAMUSCULAR | Status: AC
  Filled 2023-06-25: qty 2

## 2023-06-25 MED ORDER — ACETAMINOPHEN 500 MG PO TABS: 1000.0000 mg | ORAL_TABLET | Freq: Once | ORAL | Status: DC

## 2023-06-25 MED ORDER — FENTANYL CITRATE (PF) 100 MCG/2ML IJ SOLN
INTRAMUSCULAR | Status: AC
  Filled 2023-06-25: qty 2

## 2023-06-25 MED ORDER — PHENYLEPHRINE 80 MCG/ML (10ML) SYRINGE FOR IV PUSH (FOR BLOOD PRESSURE SUPPORT)
PREFILLED_SYRINGE | INTRAVENOUS | Status: AC
  Filled 2023-06-25: qty 10

## 2023-06-25 MED ORDER — ACETAMINOPHEN 10 MG/ML IV SOLN
INTRAVENOUS | Status: AC
Start: 2023-06-25 — End: ?
  Filled 2023-06-25: qty 100

## 2023-06-25 MED ORDER — LIDOCAINE 2% (20 MG/ML) 5 ML SYRINGE
INTRAMUSCULAR | Status: DC | PRN
  Administered 2023-06-25: 40 mg via INTRAVENOUS

## 2023-06-25 MED ORDER — HYDROMORPHONE HCL 1 MG/ML IJ SOLN: 0.2500 mg | INTRAMUSCULAR | Status: DC | PRN

## 2023-06-25 MED ORDER — GABAPENTIN 300 MG PO CAPS: 300.0000 mg | ORAL_CAPSULE | Freq: Once | ORAL | Status: DC

## 2023-06-25 MED ORDER — LIDOCAINE 2% (20 MG/ML) 5 ML SYRINGE
INTRAMUSCULAR | Status: AC
  Filled 2023-06-25: qty 5

## 2023-06-25 MED ORDER — SCOPOLAMINE 1 MG/3DAYS TD PT72
MEDICATED_PATCH | TRANSDERMAL | Status: AC
  Filled 2023-06-25: qty 1

## 2023-06-25 MED ORDER — DEXAMETHASONE SODIUM PHOSPHATE 10 MG/ML IJ SOLN
INTRAMUSCULAR | Status: AC
  Filled 2023-06-25: qty 1

## 2023-06-25 MED ORDER — ONDANSETRON HCL 4 MG PO TABS: 4.0000 mg | ORAL_TABLET | Freq: Three times a day (TID) | ORAL | 0 refills | Status: AC | PRN

## 2023-06-25 MED ORDER — ONDANSETRON HCL 4 MG/2ML IJ SOLN
INTRAMUSCULAR | Status: AC
  Filled 2023-06-25: qty 2

## 2023-06-25 MED ORDER — ACETAMINOPHEN 500 MG PO TABS: 1000.0000 mg | ORAL_TABLET | Freq: Once | ORAL | Status: AC

## 2023-06-25 MED ORDER — ACETAMINOPHEN 500 MG PO TABS
ORAL_TABLET | ORAL | Status: AC
  Filled 2023-06-25: qty 2

## 2023-06-25 MED ORDER — OXYCODONE HCL 5 MG/5ML PO SOLN: 5.0000 mg | Freq: Once | ORAL | Status: DC | PRN

## 2023-06-25 MED ORDER — OXYCODONE HCL 5 MG PO TABS: 5.0000 mg | ORAL_TABLET | Freq: Once | ORAL | Status: DC | PRN

## 2023-06-25 MED ORDER — MELOXICAM 7.5 MG PO TABS: 7.5000 mg | ORAL_TABLET | Freq: Two times a day (BID) | ORAL | 0 refills | Status: AC

## 2023-06-25 MED ORDER — VANCOMYCIN HCL 1000 MG IV SOLR
INTRAVENOUS | Status: AC
  Filled 2023-06-25: qty 60

## 2023-06-25 MED ORDER — BUPIVACAINE-EPINEPHRINE (PF) 0.5% -1:200000 IJ SOLN
INTRAMUSCULAR | Status: DC | PRN
  Administered 2023-06-25: 30 mL via PERINEURAL

## 2023-06-25 MED ORDER — PHENYLEPHRINE HCL (PRESSORS) 10 MG/ML IV SOLN
INTRAVENOUS | Status: DC | PRN
Start: 2023-06-25 — End: 2023-06-25
  Administered 2023-06-25: 80 ug via INTRAVENOUS
  Administered 2023-06-25: 160 ug via INTRAVENOUS
  Administered 2023-06-25: 180 ug via INTRAVENOUS

## 2023-06-25 MED ORDER — SCOPOLAMINE 1 MG/3DAYS TD PT72
1.0000 | MEDICATED_PATCH | TRANSDERMAL | Status: DC
  Administered 2023-06-25: 1.5 mg via TRANSDERMAL

## 2023-06-25 MED ORDER — 0.9 % SODIUM CHLORIDE (POUR BTL) OPTIME
TOPICAL | Status: DC | PRN
  Administered 2023-06-25: 200 mL

## 2023-06-25 MED ORDER — CEFAZOLIN SODIUM-DEXTROSE 2-4 GM/100ML-% IV SOLN
2.0000 g | INTRAVENOUS | Status: AC
  Administered 2023-06-25: 2 g via INTRAVENOUS

## 2023-06-25 MED ORDER — PROPOFOL 10 MG/ML IV BOLUS
INTRAVENOUS | Status: AC
  Filled 2023-06-25: qty 20

## 2023-06-25 SURGICAL SUPPLY — 51 items
ANCH BUTTON FIBERTAK KNEE SP (Anchor) IMPLANT
BLADE SURG 10 STRL SS (BLADE) ×1 IMPLANT
BLADE SURG 15 STRL LF DISP TIS (BLADE) ×1 IMPLANT
BNDG ELASTIC 4INX 5YD STR LF (GAUZE/BANDAGES/DRESSINGS) ×1 IMPLANT
BNDG ESMARK 4X9 LF (GAUZE/BANDAGES/DRESSINGS) ×1 IMPLANT
CHLORAPREP W/TINT 26 (MISCELLANEOUS) ×1 IMPLANT
CLSR STERI-STRIP ANTIMIC 1/2X4 (GAUZE/BANDAGES/DRESSINGS) ×1 IMPLANT
CORD BIPOLAR FORCEPS 12FT (ELECTRODE) ×1 IMPLANT
CUFF TOURN SGL QUICK 18X3 (MISCELLANEOUS) ×1 IMPLANT
CUFF TRNQT CYL 24X4X16.5-23 (TOURNIQUET CUFF) IMPLANT
DRAPE EXTREMITY T 121X128X90 (DISPOSABLE) ×1 IMPLANT
DRAPE U-SHAPE 47X51 STRL (DRAPES) ×1 IMPLANT
ELECTRODE REM PT RTRN 9FT ADLT (ELECTROSURGICAL) ×1 IMPLANT
EXTENSION HOSE W/PLC CONNECTON (MISCELLANEOUS) ×1 IMPLANT
GAUZE SPONGE 4X4 12PLY STRL (GAUZE/BANDAGES/DRESSINGS) ×1 IMPLANT
GLOVE BIO SURGEON STRL SZ 6.5 (GLOVE) ×1 IMPLANT
GLOVE BIO SURGEON STRL SZ8 (GLOVE) ×1 IMPLANT
GLOVE BIOGEL PI IND STRL 6.5 (GLOVE) ×1 IMPLANT
GLOVE BIOGEL PI IND STRL 8 (GLOVE) ×1 IMPLANT
GLOVE ECLIPSE 8.0 STRL XLNG CF (GLOVE) ×1 IMPLANT
GOWN STRL REUS W/ TWL LRG LVL3 (GOWN DISPOSABLE) ×2 IMPLANT
GOWN STRL REUS W/TWL XL LVL3 (GOWN DISPOSABLE) ×1 IMPLANT
KIT KNEE FIBERTAK DISP (KITS) IMPLANT
KIT STR SPEAR 1.8 FBRTK DISP (KITS) IMPLANT
NDL MAYO CATGUT SZ4 TPR NDL (NEEDLE) ×1 IMPLANT
NDL SUT 6 .5 CRC .975X.05 MAYO (NEEDLE) IMPLANT
NEEDLE MAYO CATGUT SZ4 (NEEDLE) IMPLANT
NS IRRIG 1000ML POUR BTL (IV SOLUTION) ×1 IMPLANT
PACK ARTHROSCOPY DSU (CUSTOM PROCEDURE TRAY) ×1 IMPLANT
PACK BASIN DAY SURGERY FS (CUSTOM PROCEDURE TRAY) ×1 IMPLANT
PAD CAST 4YDX4 CTTN HI CHSV (CAST SUPPLIES) ×2 IMPLANT
PENCIL SMOKE EVACUATOR (MISCELLANEOUS) ×1 IMPLANT
SHEET MEDIUM DRAPE 40X70 STRL (DRAPES) ×1 IMPLANT
SLEEVE SCD COMPRESS KNEE MED (STOCKING) ×1 IMPLANT
SLING ARM FOAM STRAP LRG (SOFTGOODS) IMPLANT
SLING ARM FOAM STRAP XLG (SOFTGOODS) IMPLANT
SPIKE FLUID TRANSFER (MISCELLANEOUS) IMPLANT
SPLINT PLASTER CAST FAST 5X30 (CAST SUPPLIES) ×10 IMPLANT
SPONGE T-LAP 18X18 ~~LOC~~+RFID (SPONGE) ×1 IMPLANT
SUCTION TUBE FRAZIER 10FR DISP (SUCTIONS) IMPLANT
SUT MNCRL AB 4-0 PS2 18 (SUTURE) ×1 IMPLANT
SUT VIC AB 0 CT1 27XBRD ANBCTR (SUTURE) ×1 IMPLANT
SUT VIC AB 2-0 CT1 TAPERPNT 27 (SUTURE) IMPLANT
SUT VIC AB 2-0 SH 27XBRD (SUTURE) IMPLANT
SUT VIC AB 3-0 SH 27X BRD (SUTURE) IMPLANT
SUTURE 2 FIBERLOOP 20 STRT BLU (SUTURE) ×1 IMPLANT
SUTURE FIBERWR #2 38 T-5 BLUE (SUTURE) IMPLANT
SUTURE TAPE 1.3 FIBERLOP 20 ST (SUTURE) IMPLANT
SYR BULB EAR ULCER 3OZ GRN STR (SYRINGE) ×1 IMPLANT
TOWEL GREEN STERILE FF (TOWEL DISPOSABLE) ×1 IMPLANT
TUBE SUCTION HIGH CAP CLEAR NV (SUCTIONS) ×1 IMPLANT

## 2023-06-25 NOTE — Anesthesia Procedure Notes (Signed)
 Anesthesia Regional Block: Supraclavicular block   Pre-Anesthetic Checklist: , timeout performed,  Correct Patient, Correct Site, Correct Laterality,  Correct Procedure, Correct Position, site marked,  Risks and benefits discussed,  Surgical consent,  Pre-op evaluation,  At surgeon's request and post-op pain management  Laterality: Left and Upper  Prep: chloraprep       Needles:  Injection technique: Single-shot  Needle Type: Echogenic Needle     Needle Length: 9cm  Needle Gauge: 21     Additional Needles:   Procedures:,,,, ultrasound used (permanent image in chart),,    Narrative:  Start time: 06/25/2023 8:16 AM End time: 06/25/2023 8:22 AM Injection made incrementally with aspirations every 5 mL.  Performed by: Personally  Anesthesiologist: Matthew Netters, MD  Additional Notes: Pt identified in Holding room.  Monitors applied. Working IV access confirmed. Timeout, Sterile prep L clavicle and neck.  #21ga ECHOgenic Arrow block needle to supraclavicular brachial plexus with US  guidance.  30cc 0.5% Bupivacaine  1:200k epi injected incrementally after negative test dose.  Patient asymptomatic, VSS, no heme aspirated, tolerated well.   Matthew Hoop, MD

## 2023-06-25 NOTE — Progress Notes (Signed)
 Patient uncomfortable swallowing gabapentin and tylenol  d/t past esophageal dilation surgery. Ofirmev  ordered by Dr. Cleora Daft.

## 2023-06-25 NOTE — Anesthesia Postprocedure Evaluation (Signed)
 Anesthesia Post Note  Patient: Matthew Carr  Procedure(s) Performed: REPAIR, TENDON, BICEPS, DISTAL (Left: Arm Lower)     Patient location during evaluation: PACU Anesthesia Type: General Level of consciousness: awake and alert, patient cooperative and oriented Pain management: pain level controlled Vital Signs Assessment: post-procedure vital signs reviewed and stable Respiratory status: spontaneous breathing, nonlabored ventilation and respiratory function stable Cardiovascular status: blood pressure returned to baseline and stable Postop Assessment: no apparent nausea or vomiting and able to ambulate Anesthetic complications: no   No notable events documented.  Last Vitals:  Vitals:   06/25/23 1030 06/25/23 1045  BP: 125/87 139/89  Pulse: 83   Resp: 12   Temp:  (!) 36.2 C  SpO2: 95% 95%    Last Pain:  Vitals:   06/25/23 1045  TempSrc:   PainSc: 0-No pain                 Vianny Schraeder,E. Usama Harkless

## 2023-06-25 NOTE — Transfer of Care (Signed)
 Immediate Anesthesia Transfer of Care Note  Patient: Matthew Carr  Procedure(s) Performed: REPAIR, TENDON, BICEPS, DISTAL (Left: Arm Lower)  Patient Location: PACU  Anesthesia Type:GA combined with regional for post-op pain  Level of Consciousness: drowsy  Airway & Oxygen Therapy: Patient Spontanous Breathing and Patient connected to face mask oxygen  Post-op Assessment: Report given to RN and Post -op Vital signs reviewed and stable  Post vital signs: Reviewed and stable  Last Vitals:  Vitals Value Taken Time  BP 138/78 06/25/23 1008  Temp    Pulse 85 06/25/23 1010  Resp 11 06/25/23 1010  SpO2 97 % 06/25/23 1010  Vitals shown include unfiled device data.  Last Pain:  Vitals:   06/25/23 0735  TempSrc: Temporal  PainSc: 0-No pain         Complications: No notable events documented.

## 2023-06-25 NOTE — Anesthesia Procedure Notes (Signed)
 Procedure Name: LMA Insertion Date/Time: 06/25/2023 9:07 AM  Performed by: Noralyn Beams, CRNAPre-anesthesia Checklist: Patient identified, Emergency Drugs available, Suction available and Patient being monitored Patient Re-evaluated:Patient Re-evaluated prior to induction Oxygen Delivery Method: Circle system utilized Preoxygenation: Pre-oxygenation with 100% oxygen Induction Type: IV induction Ventilation: Mask ventilation without difficulty LMA: LMA inserted LMA Size: 5.0 Number of attempts: 1 Airway Equipment and Method: Bite block Placement Confirmation: positive ETCO2 Tube secured with: Tape Dental Injury: Teeth and Oropharynx as per pre-operative assessment

## 2023-06-25 NOTE — Op Note (Signed)
 Orthopaedic Surgery Operative Note (CSN: [id number])  Matthew Carr  05-29-1970 Date of Surgery: 06/25/2023   Diagnoses:  biceps tendon rupture left  Procedure: Left distal biceps repair   Operative Finding Successful completion of the planned procedure.  Patient had significant retraction and scarring of the tendon to the medial structures but I was able to identify the tendon and release it without any issue.  I was able to mobilize it and with digital releases of the muscle belly I was able to obtain enough excursion to repair the tendon primarily.  We used 2 all suture anchors to do the repair including a double knotless proximal anchor to compress.  Post-operative plan: The patient will be nonweightbearing in a splint for a week transition to a brace.  The patient will be discharged home.  DVT prophylaxis Aspirin 81 mg twice daily for 6 weeks.   Pain control with PRN pain medication preferring oral medicines.  Follow up plan will be scheduled in approximately 7 days for incision check and XR.  Post-Op Diagnosis: Same Surgeons:Primary: Micheline Ahr, MD Assistants:Angela Del Favia, RNFA, Nicholas Bari PA-C Location: [facility] OR ROOM 6 Anesthesia: General with regional anesthesia Antibiotics: Ancef  2 g with local vancomycin  powder 1 g at the surgical site Tourniquet time:  Total Tourniquet Time Documented: Upper Arm (Left) - 19 minutes Total: Upper Arm (Left) - 19 minutes  Estimated Blood Loss: Minimal Complications: None Specimens: None Implants: Implant Name Type Inv. Item Serial No. Manufacturer Lot No. LRB No. Used Action  [facility] BUTTON Iona KNEE SP - [id number] Anchor [facility] Rhesa Celeste KNEE SP  [employer] [id number] Left 1 Implanted    Indications for Surgery:   Matthew Carr is a 53 y.o. male with retracted distal biceps rupture.  Benefits and risks of operative and nonoperative management were discussed prior to surgery with patient/guardian(s) and informed consent form  was completed.  Specific risks including infection, need for additional surgery, neurovascular damage, stiffness, heterotopic bone and rerupture amongst others.   Procedure:   The patient was identified properly. Informed consent was obtained and the surgical site was marked. The patient was taken up to suite where general anesthesia was induced.  The patient was positioned supine on a hand table.  The left elbow was prepped and draped in the usual sterile fashion.  Timeout was performed before the beginning of the case.  Tourniquet was used for the above duration.  We made a transverse 4 cm incision about 3 cm distal to the elbow crease and sharply incised the skin achieving hemostasis as we progressed.  We dissected the superficial subcutaneous tissue bluntly with a scissor isolating the mobilizing any superficial veins.  At this point we identified the interval between the pronator and the brachial radialis and bluntly dissected with finger dissection.  The lateral antebrachial cutaneous nerve was noted running along the radial border of the radial border of approach the approach with great care to avoid excess radially based retraction to avoid damage to this nerve.    The tear was quite retracted and scarred medially.  I was able to identify the tendon and free it using a blunt Metzenbaum scissor and with digital palpation I was able to release it from the adherent tissues around it.  At this point the biceps tendon was pulled with traction using an Allis clamp and were able to mobilize it and identified that it had good excursion.    We used a #2 FiberWire style fiberloop and from proximal  to distal performed a locking Krakw type suture moving in the button.  At this point we proceeded to place blunt retractors around the radial tuberosity and maximally supinate the arm to bring the tuberosity into view.  Was cleared of soft tissue and we were able to place a 2.6 mm spade tipped wire  unicortically.  We then placed a 2.6 biceps fibertak button.  We flipped it intramedullary and checked its deployment pulling back on the sutures.  We then placed a knotless 1.8 mm fibertak just 6-8 mm proximal on the tuberosity.  We checked its fixation after placement.   At that point we shuttled the fiberloop sutures through the corresponding passing sutures for the fibertak button and then were able to shuttle the tendon down to bone.  We passed one limb through the tendon and tied alternating half hitches securing the tendon.  We then used the knotless 1.8 fibertak working limb to obtain fixation 6-8 mm proximal on the tendon and were able to secure a large footprint of tendon on bone which had been previously prepared for healing.  We cut excess suture and were happy with the construct.   We irrigated the wound copiously before placing local antibiotic as listed above.  We closed the incision in a multilayer fashion with absorbable suture.  Sterile dressing was placed.  Splint was placed. Patient was awoken taken to PACU in stable condition.  Nicholas Bari, PA-C, present and scrubbed throughout the case, critical for completion in a timely fashion, and for retraction, instrumentation, closure.

## 2023-06-25 NOTE — Progress Notes (Signed)
Assisted Dr. Jairo Ben with left, supraclavicular, ultrasound guided block. Side rails up, monitors on throughout procedure. See vital signs in flow sheet. Tolerated Procedure well.

## 2023-06-25 NOTE — Interval H&P Note (Signed)
 All questions answered, patient wants to proceed with procedure. ? ?

## 2023-06-26 ENCOUNTER — Encounter (HOSPITAL_BASED_OUTPATIENT_CLINIC_OR_DEPARTMENT_OTHER): Payer: Self-pay | Admitting: Orthopaedic Surgery

## 2023-10-28 ENCOUNTER — Telehealth: Payer: Self-pay

## 2023-10-28 ENCOUNTER — Ambulatory Visit: Payer: Self-pay | Admitting: Family Medicine

## 2023-10-28 ENCOUNTER — Encounter: Payer: Self-pay | Admitting: Family Medicine

## 2023-10-28 VITALS — BP 158/82 | HR 79 | Ht 70.0 in | Wt 208.0 lb

## 2023-10-28 DIAGNOSIS — Z130 Encounter for screening for diseases of the blood and blood-forming organs and certain disorders involving the immune mechanism: Secondary | ICD-10-CM | POA: Diagnosis not present

## 2023-10-28 DIAGNOSIS — Z0000 Encounter for general adult medical examination without abnormal findings: Secondary | ICD-10-CM | POA: Diagnosis not present

## 2023-10-28 DIAGNOSIS — R030 Elevated blood-pressure reading, without diagnosis of hypertension: Secondary | ICD-10-CM | POA: Diagnosis not present

## 2023-10-28 DIAGNOSIS — Z13220 Encounter for screening for lipoid disorders: Secondary | ICD-10-CM | POA: Diagnosis not present

## 2023-10-28 NOTE — Telephone Encounter (Signed)
 Form Dropped off to be completed as discussed in the appt placed in [facility] folder

## 2023-10-28 NOTE — Patient Instructions (Signed)
Labs ordered.  Follow up annually.  Take care  Dr. Adriana Simas

## 2023-10-29 ENCOUNTER — Ambulatory Visit: Payer: Self-pay | Admitting: Family Medicine

## 2023-10-29 DIAGNOSIS — Z0000 Encounter for general adult medical examination without abnormal findings: Secondary | ICD-10-CM | POA: Insufficient documentation

## 2023-10-29 LAB — LIPID PANEL
Chol/HDL Ratio: 4.7 ratio (ref 0.0–5.0)
Cholesterol, Total: 166 mg/dL (ref 100–199)
HDL: 35 mg/dL — ABNORMAL LOW (ref >39)
LDL Chol Calc (NIH): 89 mg/dL (ref 0–99)
Triglycerides: 249 mg/dL — ABNORMAL HIGH (ref 0–149)
VLDL Cholesterol Cal: 42 mg/dL — ABNORMAL HIGH (ref 5–40)

## 2023-10-29 LAB — CBC
Hematocrit: 50 % (ref 37.5–51.0)
Hemoglobin: 16.2 g/dL (ref 13.0–17.7)
MCH: 29.5 pg (ref 26.6–33.0)
MCHC: 32.4 g/dL (ref 31.5–35.7)
MCV: 91 fL (ref 79–97)
Platelets: 259 x10E3/uL (ref 150–450)
RBC: 5.49 x10E6/uL (ref 4.14–5.80)
RDW: 13.2 % (ref 11.6–15.4)
WBC: 9.2 x10E3/uL (ref 3.4–10.8)

## 2023-10-29 LAB — CMP14+EGFR
ALT: 34 IU/L (ref 0–44)
AST: 31 IU/L (ref 0–40)
Albumin: 4.8 g/dL (ref 3.8–4.9)
Alkaline Phosphatase: 96 IU/L (ref 47–123)
BUN/Creatinine Ratio: 11 (ref 9–20)
BUN: 12 mg/dL (ref 6–24)
Bilirubin Total: 0.4 mg/dL (ref 0.0–1.2)
CO2: 23 mmol/L (ref 20–29)
Calcium: 9.4 mg/dL (ref 8.7–10.2)
Chloride: 100 mmol/L (ref 96–106)
Creatinine, Ser: 1.1 mg/dL (ref 0.76–1.27)
Globulin, Total: 2.7 g/dL (ref 1.5–4.5)
Glucose: 85 mg/dL (ref 70–99)
Potassium: 4 mmol/L (ref 3.5–5.2)
Sodium: 140 mmol/L (ref 134–144)
Total Protein: 7.5 g/dL (ref 6.0–8.5)
eGFR: 80 mL/min/1.73 (ref >59)

## 2023-10-29 NOTE — Assessment & Plan Note (Signed)
 BP elevated here today. Advised to check/monitor at home. Labs ordered. Vaccines declined.  Health maintenance section updated.

## 2023-10-29 NOTE — Progress Notes (Signed)
 Subjective:  Patient ID: Matthew Carr, male    DOB: 04/11/1970  Age: 53 y.o. MRN: [id number]  CC:   Chief Complaint  Patient presents with   Establish Care   Annual Exam    HPI:  53 year old male presents to establish care. Needs an annual physical.  Former patient of Dr. Bertell. Feeling well. No chest pain or SOB.  BP elevated here today.  Declines vaccines. Unsure of last tetanus. Colonoscopy up to date. Declines HIV and Hep C screening.   Patient Active Problem List   Diagnosis Date Noted   Annual physical exam 10/29/2023   Eosinophilic esophagitis 01/11/2019   GERD (gastroesophageal reflux disease) 03/28/2016    Social Hx   Social History   Socioeconomic History   Marital status: Married    Spouse name: Not on file   Number of children: Not on file   Years of education: Not on file   Highest education level: 12th grade  Occupational History   Not on file  Tobacco Use   Smoking status: Never   Smokeless tobacco: Never  Vaping Use   Vaping status: Never Used  Substance and Sexual Activity   Alcohol use: Yes    Comment: wine occasionally   Drug use: No   Sexual activity: Yes    Birth control/protection: None  Other Topics Concern   Not on file  Social History Narrative   Not on file   Social Drivers of Health   Financial Resource Strain: Low Risk  (10/24/2023)   Overall Financial Resource Strain (CARDIA)    Difficulty of Paying Living Expenses: Not hard at all  Food Insecurity: No Food Insecurity (10/24/2023)   Hunger Vital Sign    Worried About Running Out of Food in the Last Year: Never true    Ran Out of Food in the Last Year: Never true  Transportation Needs: No Transportation Needs (10/24/2023)   PRAPARE - Administrator, Civil Service (Medical): No    Lack of Transportation (Non-Medical): No  Physical Activity: Insufficiently Active (10/24/2023)   Exercise Vital Sign    Days of Exercise per Week: 5 days    Minutes of Exercise per  Session: 20 min  Stress: No Stress Concern Present (10/24/2023)   [employer] of Occupational Health - Occupational Stress Questionnaire    Feeling of Stress: Only a little  Social Connections: Moderately Isolated (10/24/2023)   Social Connection and Isolation Panel    Frequency of Communication with Friends and Family: More than three times a week    Frequency of Social Gatherings with Friends and Family: Once a week    Attends Religious Services: Patient declined    Database administrator or Organizations: No    Attends Engineer, structural: Not on file    Marital Status: Married    Review of Systems Per HPI  Objective:  BP (!) 158/82   Pulse 79   Ht 5' 10 (1.778 m)   Wt 208 lb (94.3 kg)   BMI 29.84 kg/m      10/28/2023    3:23 PM 10/28/2023    2:58 PM 06/25/2023   10:45 AM  BP/Weight  Systolic BP 158 158 139  Diastolic BP 82 102 89  Wt. (Lbs)  208   BMI  29.84 kg/m2     Physical Exam Vitals and nursing note reviewed.  Constitutional:      General: He is not in acute distress.    Appearance:  Normal appearance.  HENT:     Head: Normocephalic and atraumatic.  Eyes:     General:        Right eye: No discharge.        Left eye: No discharge.     Conjunctiva/sclera: Conjunctivae normal.  Cardiovascular:     Rate and Rhythm: Normal rate and regular rhythm.  Pulmonary:     Effort: Pulmonary effort is normal.     Breath sounds: Normal breath sounds. No wheezing, rhonchi or rales.  Neurological:     Mental Status: He is alert.  Psychiatric:        Mood and Affect: Mood normal.        Behavior: Behavior normal.     Lab Results  Component Value Date   WBC 9.2 10/28/2023   HGB 16.2 10/28/2023   HCT 50.0 10/28/2023   PLT 259 10/28/2023   GLUCOSE 85 10/28/2023   CHOL 166 10/28/2023   TRIG 249 (H) 10/28/2023   HDL 35 (L) 10/28/2023   LDLCALC 89 10/28/2023   ALT 34 10/28/2023   AST 31 10/28/2023   NA 140 10/28/2023   K 4.0 10/28/2023   CL 100  10/28/2023   CREATININE 1.10 10/28/2023   BUN 12 10/28/2023   CO2 23 10/28/2023     Assessment & Plan:  Annual physical exam Assessment & Plan: BP elevated here today. Advised to check/monitor at home. Labs ordered. Vaccines declined.  Health maintenance section updated.   Screening for deficiency anemia -     CBC  Elevated BP without diagnosis of hypertension -     CMP14+EGFR  Screening, lipid -     Lipid panel    Follow-up:  Annually  Jacqulyn Ahle DO [facility]

## 2023-11-06 NOTE — Telephone Encounter (Signed)
 Faxed on 11/04/2023 and patient has picked up on 11/06/2023
